# Patient Record
Sex: Female | Born: 1935 | Race: Black or African American | Hispanic: No | Marital: Married | Smoking: Former smoker
Health system: Southern US, Community
[De-identification: ages and names within clinical notes are randomized; demographics above are authoritative.]

## PROBLEM LIST (undated history)

## (undated) DIAGNOSIS — I10 Essential (primary) hypertension: Secondary | ICD-10-CM

## (undated) DIAGNOSIS — E559 Vitamin D deficiency, unspecified: Secondary | ICD-10-CM

## (undated) DIAGNOSIS — C3492 Malignant neoplasm of unspecified part of left bronchus or lung: Secondary | ICD-10-CM

## (undated) DIAGNOSIS — D649 Anemia, unspecified: Secondary | ICD-10-CM

## (undated) DIAGNOSIS — M810 Age-related osteoporosis without current pathological fracture: Secondary | ICD-10-CM

## (undated) DIAGNOSIS — M1990 Unspecified osteoarthritis, unspecified site: Secondary | ICD-10-CM

## (undated) DIAGNOSIS — E119 Type 2 diabetes mellitus without complications: Secondary | ICD-10-CM

## (undated) DIAGNOSIS — E785 Hyperlipidemia, unspecified: Secondary | ICD-10-CM

## (undated) HISTORY — PX: CATARACT EXTRACTION, BILATERAL: SHX1313

## (undated) HISTORY — PX: EYE SURGERY: SHX253

## (undated) HISTORY — DX: Type 2 diabetes mellitus without complications: E11.9

## (undated) HISTORY — DX: Age-related osteoporosis without current pathological fracture: M81.0

## (undated) HISTORY — DX: Hyperlipidemia, unspecified: E78.5

## (undated) HISTORY — DX: Malignant neoplasm of unspecified part of left bronchus or lung: C34.92

## (undated) HISTORY — DX: Anemia, unspecified: D64.9

## (undated) HISTORY — PX: CARPAL TUNNEL RELEASE: SHX101

## (undated) HISTORY — DX: Vitamin D deficiency, unspecified: E55.9

## (undated) HISTORY — PX: FOOT SURGERY: SHX648

## (undated) HISTORY — PX: KNEE ARTHROPLASTY: SHX992

## (undated) HISTORY — DX: Essential (primary) hypertension: I10

---

## 2005-06-15 ENCOUNTER — Encounter: Admission: RE | Admit: 2005-06-15 | Discharge: 2005-06-15 | Payer: Self-pay | Admitting: Neurology

## 2005-07-02 ENCOUNTER — Ambulatory Visit (HOSPITAL_COMMUNITY): Admission: RE | Admit: 2005-07-02 | Discharge: 2005-07-02 | Payer: Self-pay | Admitting: Neurology

## 2006-09-17 ENCOUNTER — Encounter: Admission: RE | Admit: 2006-09-17 | Discharge: 2006-09-17 | Payer: Self-pay | Admitting: Neurology

## 2011-10-05 DIAGNOSIS — H26499 Other secondary cataract, unspecified eye: Secondary | ICD-10-CM | POA: Insufficient documentation

## 2011-10-05 DIAGNOSIS — E119 Type 2 diabetes mellitus without complications: Secondary | ICD-10-CM | POA: Insufficient documentation

## 2011-10-09 DIAGNOSIS — Z9889 Other specified postprocedural states: Secondary | ICD-10-CM | POA: Insufficient documentation

## 2012-03-03 DIAGNOSIS — M79606 Pain in leg, unspecified: Secondary | ICD-10-CM | POA: Insufficient documentation

## 2012-03-03 DIAGNOSIS — E1142 Type 2 diabetes mellitus with diabetic polyneuropathy: Secondary | ICD-10-CM | POA: Insufficient documentation

## 2012-08-29 DIAGNOSIS — Z961 Presence of intraocular lens: Secondary | ICD-10-CM | POA: Insufficient documentation

## 2012-09-13 DIAGNOSIS — M8580 Other specified disorders of bone density and structure, unspecified site: Secondary | ICD-10-CM | POA: Insufficient documentation

## 2013-05-10 DIAGNOSIS — H401190 Primary open-angle glaucoma, unspecified eye, stage unspecified: Secondary | ICD-10-CM | POA: Insufficient documentation

## 2014-01-24 DIAGNOSIS — F17200 Nicotine dependence, unspecified, uncomplicated: Secondary | ICD-10-CM | POA: Insufficient documentation

## 2014-01-24 DIAGNOSIS — J189 Pneumonia, unspecified organism: Secondary | ICD-10-CM | POA: Insufficient documentation

## 2014-01-24 DIAGNOSIS — J159 Unspecified bacterial pneumonia: Secondary | ICD-10-CM | POA: Insufficient documentation

## 2014-01-24 DIAGNOSIS — R918 Other nonspecific abnormal finding of lung field: Secondary | ICD-10-CM | POA: Insufficient documentation

## 2014-01-24 DIAGNOSIS — D649 Anemia, unspecified: Secondary | ICD-10-CM | POA: Insufficient documentation

## 2014-02-21 LAB — PULMONARY FUNCTION TEST

## 2014-03-14 ENCOUNTER — Encounter: Payer: Self-pay | Admitting: Thoracic Surgery (Cardiothoracic Vascular Surgery)

## 2014-03-14 ENCOUNTER — Other Ambulatory Visit: Payer: Self-pay

## 2014-03-19 ENCOUNTER — Institutional Professional Consult (permissible substitution) (INDEPENDENT_AMBULATORY_CARE_PROVIDER_SITE_OTHER): Payer: Medicare Other | Admitting: Thoracic Surgery (Cardiothoracic Vascular Surgery)

## 2014-03-19 ENCOUNTER — Encounter: Payer: Self-pay | Admitting: Thoracic Surgery (Cardiothoracic Vascular Surgery)

## 2014-03-19 VITALS — BP 154/69 | HR 79 | Resp 16 | Ht 64.0 in | Wt 152.0 lb

## 2014-03-19 DIAGNOSIS — J439 Emphysema, unspecified: Secondary | ICD-10-CM | POA: Diagnosis not present

## 2014-03-19 DIAGNOSIS — R911 Solitary pulmonary nodule: Secondary | ICD-10-CM

## 2014-03-19 DIAGNOSIS — E785 Hyperlipidemia, unspecified: Secondary | ICD-10-CM | POA: Insufficient documentation

## 2014-03-19 DIAGNOSIS — I10 Essential (primary) hypertension: Secondary | ICD-10-CM | POA: Insufficient documentation

## 2014-03-19 DIAGNOSIS — H409 Unspecified glaucoma: Secondary | ICD-10-CM | POA: Insufficient documentation

## 2014-03-19 DIAGNOSIS — E119 Type 2 diabetes mellitus without complications: Secondary | ICD-10-CM | POA: Diagnosis not present

## 2014-03-19 DIAGNOSIS — G2581 Restless legs syndrome: Secondary | ICD-10-CM | POA: Insufficient documentation

## 2014-03-19 DIAGNOSIS — K219 Gastro-esophageal reflux disease without esophagitis: Secondary | ICD-10-CM | POA: Insufficient documentation

## 2014-03-19 NOTE — Progress Notes (Signed)
PCP is No primary care provider on file. Referring Provider is Gardiner Rhyme, MD  Chief Complaint  Patient presents with  . Lung Lesion    eval and treat...CT/PET/PFT'S    HPI: 81 year old woman sent for consultation regarding a left lower lobe mass.  Gwendolyn Miller is an 81 year old woman with a history of tobacco abuse dating back from age 61 to December 2015. She was never a heavy smoker but did smoke up to a half a pack a day. Her total exposure is 60 pack years. She was in her usual state of health until December 2015. She developed a cough and general malaise. She denied any fevers or chills. Her cough was productive. She was hospitalized with pneumonia. Her chest x-ray showed a right middle lobe infiltrate. There also was a question of a left lung nodule. CT scan confirmed the right middle lobe infiltrate and there was a 2 cm nodule in the superior segment of the left lower lobe. She was only hospitalized for about 3 days and then was discharged home.  She had a PET CT done as an outpatient. It showed the left lower lobe nodule to be hypermetabolic. The SUV was 8.8. Per the radiologist's report the right middle lobe infiltrate had cleared.  She says she's been feeling well. She does most of the work around the house. Her husband does have some health problems. Her appetite is been poor. She says she gets full very quickly. She denies any weight loss over the past 3 or 6 months. She says that she can walk up a flight of stairs easily without shortness of breath. She says that she would get unsteady if she try to do 2 flights but not necessarily short of breath. She has not been having any chest pain, pressure, or tightness either at rest or with exertion. She uses a Spiriva inhaler and Symbicort on a daily basis. She says she does not have any wheezing. She is not using a rescue inhaler. She denies any cardiac history. She does have a history of hypertension.  Zubrod Score: At the time of  surgery this patient's most appropriate activity status/level should be described as: []     0    Normal activity, no symptoms [x]     1    Restricted in physical strenuous activity but ambulatory, able to do out light work []     2    Ambulatory and capable of self care, unable to do work activities, up and about >50 % of waking hours                              []     3    Only limited self care, in bed greater than 50% of waking hours []     4    Completely disabled, no self care, confined to bed or chair []     5    Moribund    Past Medical History  Diagnosis Date  . Hypertension   . Hyperlipidemia   . Anemia   . Vitamin D deficiency   . Osteoporosis   . Diabetes mellitus, type 2   . Asthma     mild    Past Surgical History  Procedure Laterality Date  . Knee arthroplasty    . Cataract extraction, bilateral      Family History  Problem Relation Age of Onset  . Heart failure Father     de  .  Dementia Sister     Social History History  Substance Use Topics  . Smoking status: Former Smoker -- 0.50 packs/day for 60 years    Types: Cigarettes    Quit date: 01/09/2014  . Smokeless tobacco: Never Used  . Alcohol Use: No    Current Outpatient Prescriptions  Medication Sig Dispense Refill  . alendronate (FOSAMAX) 70 MG tablet   0  . amLODipine (NORVASC) 10 MG tablet     . atenolol (TENORMIN) 100 MG tablet     . COSOPT PF 22.3-6.8 MG/ML SOLN   12  . gabapentin (NEURONTIN) 400 MG capsule   0  . guaiFENesin (MUCINEX) 600 MG 12 hr tablet   1  . ketorolac (ACULAR) 0.4 % SOLN     . latanoprost (XALATAN) 0.005 % ophthalmic solution   12  . oxyCODONE-acetaminophen (PERCOCET/ROXICET) 5-325 MG per tablet   0  . pramipexole (MIRAPEX) 0.125 MG tablet   3  . SPIRIVA HANDIHALER 18 MCG inhalation capsule   2  . SYMBICORT 160-4.5 MCG/ACT inhaler   3  . topiramate (TOPAMAX) 100 MG tablet      No current facility-administered medications for this visit.    Allergies  Allergen  Reactions  . Erythromycin Swelling and Nausea And Vomiting    Other reaction(s): Nausea And Vomiting  . Metaxalone Rash    Chest pain Chest pain  . Pramipexole Rash    abd pain  . Ace Inhibitors     Other reaction(s): Cough cough  . Zolpidem Nausea Only    Review of Systems  Constitutional: Positive for appetite change (early satiety). Negative for fever, chills, activity change and unexpected weight change (Denies weight loss).  Respiratory: Positive for cough (Pneumonia in December). Negative for shortness of breath.   Cardiovascular: Positive for leg swelling (When she eats a lot of salt). Negative for chest pain.  Neurological: Negative.   Hematological: Negative for adenopathy. Does not bruise/bleed easily.  All other systems reviewed and are negative.   BP 154/69 mmHg  Pulse 79  Resp 16  Ht 5\' 4"  (1.626 m)  Wt 152 lb (68.947 kg)  BMI 26.08 kg/m2  SpO2 98% Physical Exam  Constitutional: She is oriented to person, place, and time. She appears well-developed and well-nourished. No distress.  HENT:  Head: Normocephalic and atraumatic.  Eyes: EOM are normal.  Neck: Neck supple. No thyromegaly present.  Cardiovascular: Normal rate, normal heart sounds and intact distal pulses.   No murmur heard. Pulmonary/Chest: Effort normal and breath sounds normal. She has no wheezes. She has no rales.  Abdominal: Soft. There is no tenderness.  Musculoskeletal: She exhibits no edema.  Lymphadenopathy:    She has no cervical adenopathy.  Neurological: She is alert and oriented to person, place, and time. No cranial nerve deficit.  Skin: Skin is warm and dry.  Vitals reviewed.    Diagnostic Tests: CT chest [facility] 01/24/2014  Impression Consolidation in the right middle lobe is more concerning for pneumonia, less likely neoplasm. Opacity in left midlung is more concerning for neoplasm, less likely pneumonia 4 mm left lung nodule  PET/CT [facility]  02/22/2014  Impression 1. Hypermetabolic left lower lobe nodule worrisome for neoplasm. 2. Interval clearing of right middle lobe infiltrate.  Pulmonary function testing FVC 1.42 (67%) FEV1 1.22 (74%) TLC 64% of predicted VC 73% predicted Diffusion capacity 15.1 (85%) Minimal change with bronchodilator (patient taking once daily bronchodilator baseline)   Impression: 81 year old woman with a 2 cm mass in the  superior segment of the left lower lobe that is hypermetabolic by PET with an SUV of 8.8. There is no evidence of regional or distant metastases.  I personally reviewed the chest x-ray and CT that she brought along with her on disc. I also reviewed her pulmonary function testing. Unfortunately the PET images are not available to me. I based my recommendation on my interpretation of the CT as well as the radiologist's interpretation of the PET. This is highly suspicious for a new primary bronchogenic carcinoma.   I discussed our options for diagnosis and treatment. This is a very peripheral lesion that would be hard to access bronchoscopically. It is amenable to CT-guided percutaneous biopsy. We discussed the advantages and disadvantages of surgical resection versus a CT-guided biopsy. Given the highly suspicious nature of the lesion I think that if she is willing to undergo surgical resection that there is no need to get a CT-guided biopsy first. If she wished to be treated with radiation and/or chemotherapy to CT-guided biopsy would be indicated.  I discussed the proposed surgical procedure with Mr. and Mrs. Dady. We reviewed the general nature of the procedure, the incisions to be used, the need for general anesthesia, and the intraoperative decision making. They understand that we will plan to do a bronchoscopy, left video-assisted thoracoscopy and superior segmentectomy of the left lower lobe. This will be both diagnostic and therapeutic. Although she has a clinical stage IA lesion  they understand that additional therapy might be recommended based on pathologic findings at time of surgery. I reviewed the indications, risks, benefits, and alternatives. She understands the risks include, but are not limited to death, MI, stroke, DVT, PE, bleeding, possibly for transfusion, infection, prolonged air leak, cardiac arrhythmias, as well as the possibility of unforeseeable complications. They understand that no guarantee of cure can be given.  She accepts the risks of surgery and agrees to proceed.  Plan: Bronchoscopy, Left video-assisted thoracoscopy, left lower lobe superior segmentectomy on Monday February 22nd, 2016.  We will request PET/CT to be sent from [facility]. I also asked her to bring a copy of the PET on disc when she comes for surgery.  I spent 60 minutes with Mrs. Gwendolyn Miller.D.

## 2014-03-20 ENCOUNTER — Other Ambulatory Visit: Payer: Self-pay | Admitting: *Deleted

## 2014-03-20 DIAGNOSIS — R911 Solitary pulmonary nodule: Secondary | ICD-10-CM

## 2014-03-29 ENCOUNTER — Encounter (HOSPITAL_COMMUNITY): Payer: Self-pay

## 2014-03-29 ENCOUNTER — Encounter (HOSPITAL_COMMUNITY)
Admission: RE | Admit: 2014-03-29 | Discharge: 2014-03-29 | Disposition: A | Discharge status: Home / Self Care | Payer: Medicare Other | Source: Ambulatory Visit | Attending: Thoracic Surgery (Cardiothoracic Vascular Surgery) | Admitting: Thoracic Surgery (Cardiothoracic Vascular Surgery)

## 2014-03-29 VITALS — BP 139/60 | HR 79 | Temp 98.5°F | Resp 18 | Ht 64.0 in

## 2014-03-29 DIAGNOSIS — Z01818 Encounter for other preprocedural examination: Secondary | ICD-10-CM | POA: Insufficient documentation

## 2014-03-29 DIAGNOSIS — R911 Solitary pulmonary nodule: Secondary | ICD-10-CM | POA: Diagnosis not present

## 2014-03-29 HISTORY — DX: Unspecified osteoarthritis, unspecified site: M19.90

## 2014-03-29 LAB — COMPREHENSIVE METABOLIC PANEL
ALBUMIN: 3.8 g/dL (ref 3.5–5.2)
ALK PHOS: 47 U/L (ref 39–117)
ALT: 13 U/L (ref 0–35)
AST: 22 U/L (ref 0–37)
Anion gap: 10 (ref 5–15)
BUN: 8 mg/dL (ref 6–23)
CHLORIDE: 112 mmol/L (ref 96–112)
CO2: 19 mmol/L (ref 19–32)
Calcium: 9.3 mg/dL (ref 8.4–10.5)
Creatinine, Ser: 0.73 mg/dL (ref 0.50–1.10)
GFR calc Af Amer: >90 mL/min (ref >90)
GFR calc non Af Amer: 79 mL/min — ABNORMAL LOW (ref >90)
Glucose, Bld: 124 mg/dL — ABNORMAL HIGH (ref 70–99)
POTASSIUM: 4 mmol/L (ref 3.5–5.1)
Sodium: 141 mmol/L (ref 135–145)
Total Bilirubin: 0.5 mg/dL (ref 0.3–1.2)
Total Protein: 6.9 g/dL (ref 6.0–8.3)

## 2014-03-29 LAB — URINALYSIS, ROUTINE W REFLEX MICROSCOPIC
BILIRUBIN URINE: NEGATIVE
GLUCOSE, UA: NEGATIVE mg/dL
HGB URINE DIPSTICK: NEGATIVE
Ketones, ur: NEGATIVE mg/dL
Leukocytes, UA: NEGATIVE
NITRITE: NEGATIVE
PH: 6.5 (ref 5.0–8.0)
PROTEIN: 30 mg/dL — AB
SPECIFIC GRAVITY, URINE: 1.018 (ref 1.005–1.030)
UROBILINOGEN UA: 1 mg/dL (ref 0.0–1.0)

## 2014-03-29 LAB — TYPE AND SCREEN
ABO/RH(D): B POS
Antibody Screen: NEGATIVE

## 2014-03-29 LAB — URINE MICROSCOPIC-ADD ON

## 2014-03-29 LAB — BLOOD GAS, ARTERIAL
ACID-BASE DEFICIT: 3.6 mmol/L — AB (ref 0.0–2.0)
Bicarbonate: 20.7 mEq/L (ref 20.0–24.0)
Drawn by: 42180
FIO2: 0.21 %
O2 SAT: 92.6 %
PATIENT TEMPERATURE: 98.6
TCO2: 21.8 mmol/L (ref 0–100)
pCO2 arterial: 35.9 mmHg (ref 35.0–45.0)
pH, Arterial: 7.379 (ref 7.350–7.450)
pO2, Arterial: 66.5 mmHg — ABNORMAL LOW (ref 80.0–100.0)

## 2014-03-29 LAB — SURGICAL PCR SCREEN
MRSA, PCR: NEGATIVE
Staphylococcus aureus: NEGATIVE

## 2014-03-29 LAB — CBC
HEMATOCRIT: 36.6 % (ref 36.0–46.0)
HEMOGLOBIN: 11.5 g/dL — AB (ref 12.0–15.0)
MCH: 22.5 pg — ABNORMAL LOW (ref 26.0–34.0)
MCHC: 31.4 g/dL (ref 30.0–36.0)
MCV: 71.5 fL — ABNORMAL LOW (ref 78.0–100.0)
Platelets: 249 10*3/uL (ref 150–400)
RBC: 5.12 MIL/uL — AB (ref 3.87–5.11)
RDW: 16.8 % — ABNORMAL HIGH (ref 11.5–15.5)
WBC: 6 10*3/uL (ref 4.0–10.5)

## 2014-03-29 LAB — ABO/RH: ABO/RH(D): B POS

## 2014-03-29 LAB — PROTIME-INR
INR: 0.96 (ref 0.00–1.49)
Prothrombin Time: 12.9 seconds (ref 11.6–15.2)

## 2014-03-29 LAB — APTT: APTT: 32 s (ref 24–37)

## 2014-03-29 NOTE — Pre-Procedure Instructions (Signed)
Gwendolyn Miller  03/29/2014   Your procedure is scheduled on:  Monday, feb. 22  Report to [facility] Main Entrance "A" at 5:30 AM.  Call this number if you have problems the morning of surgery: [phone]               Any questions prior ot surgery call pre-admission [phone] (Monday-Friday  8:00am-4:00pm)   Remember:   Do not eat food or drink liquids after midnight.   Take these medicines the morning of surgery with A SIP OF WATER: amoldipine (norvasc), atenolol (tenormin), cosopt eye drops, gabapentin(neurontin), acular eye drop, omeprazole (prilosec), oxycodone if needed, symbicort (bring both of the inhalers)   Do not wear jewelry, make-up or nail polish.  Do not wear lotions, powders, or perfumes. You may wear deodorant.  Do not shave 48 hours prior to surgery. Men may shave face and neck.  Do not bring valuables to the hospital.  [facility] is not responsible   for any belongings or valuables.               Contacts, dentures or bridgework may not be worn into surgery.  Leave suitcase in the car. After surgery it may be brought to your room.  For patients admitted to the hospital, discharge time is determined by your  treatment team.               Patients discharged the day of surgery will not be allowed to drive  home.  Name and phone number of your driver:   Special Instructions: review preparing for surgery   Please read over the following fact sheets that you were given: Pain Booklet, Coughing and Deep Breathing, Blood Transfusion Information, MRSA Information and Surgical Site Infection Prevention

## 2014-03-29 NOTE — Progress Notes (Signed)
[facility]  PU[facility]ologist: Dr. Gardiner Rhyme [facility]

## 2014-04-01 MED ORDER — DEXTROSE 5 % IV SOLN
1.5000 g | INTRAVENOUS | Status: AC
  Administered 2014-04-02: 1.5 g via INTRAVENOUS
  Filled 2014-04-01: qty 1.5

## 2014-04-02 ENCOUNTER — Inpatient Hospital Stay (HOSPITAL_COMMUNITY): Payer: Medicare Other

## 2014-04-02 ENCOUNTER — Inpatient Hospital Stay (HOSPITAL_COMMUNITY): Payer: Medicare Other | Admitting: Certified Registered Nurse Anesthetist

## 2014-04-02 ENCOUNTER — Inpatient Hospital Stay (HOSPITAL_COMMUNITY)
Admission: RE | Admit: 2014-04-02 | Discharge: 2014-04-06 | DRG: 165 | Disposition: A | Discharge status: Home / Self Care | Payer: Medicare Other | Source: Ambulatory Visit | Attending: Thoracic Surgery (Cardiothoracic Vascular Surgery) | Admitting: Thoracic Surgery (Cardiothoracic Vascular Surgery)

## 2014-04-02 ENCOUNTER — Encounter (HOSPITAL_COMMUNITY): Payer: Self-pay | Admitting: *Deleted

## 2014-04-02 ENCOUNTER — Encounter (HOSPITAL_COMMUNITY)
Admission: RE | Disposition: A | Discharge status: Home / Self Care | Payer: Self-pay | Source: Ambulatory Visit | Attending: Thoracic Surgery (Cardiothoracic Vascular Surgery)

## 2014-04-02 DIAGNOSIS — C3432 Malignant neoplasm of lower lobe, left bronchus or lung: Principal | ICD-10-CM | POA: Diagnosis present

## 2014-04-02 DIAGNOSIS — M810 Age-related osteoporosis without current pathological fracture: Secondary | ICD-10-CM | POA: Diagnosis present

## 2014-04-02 DIAGNOSIS — E876 Hypokalemia: Secondary | ICD-10-CM | POA: Diagnosis not present

## 2014-04-02 DIAGNOSIS — Z4682 Encounter for fitting and adjustment of non-vascular catheter: Secondary | ICD-10-CM

## 2014-04-02 DIAGNOSIS — R911 Solitary pulmonary nodule: Secondary | ICD-10-CM | POA: Diagnosis present

## 2014-04-02 DIAGNOSIS — Z902 Acquired absence of lung [part of]: Secondary | ICD-10-CM

## 2014-04-02 DIAGNOSIS — E119 Type 2 diabetes mellitus without complications: Secondary | ICD-10-CM | POA: Diagnosis present

## 2014-04-02 DIAGNOSIS — E785 Hyperlipidemia, unspecified: Secondary | ICD-10-CM | POA: Diagnosis present

## 2014-04-02 DIAGNOSIS — C3490 Malignant neoplasm of unspecified part of unspecified bronchus or lung: Secondary | ICD-10-CM

## 2014-04-02 DIAGNOSIS — D649 Anemia, unspecified: Secondary | ICD-10-CM | POA: Diagnosis present

## 2014-04-02 DIAGNOSIS — I10 Essential (primary) hypertension: Secondary | ICD-10-CM | POA: Diagnosis present

## 2014-04-02 DIAGNOSIS — Z87891 Personal history of nicotine dependence: Secondary | ICD-10-CM | POA: Diagnosis not present

## 2014-04-02 HISTORY — PX: VIDEO ASSISTED THORACOSCOPY (VATS)/WEDGE RESECTION: SHX6174

## 2014-04-02 HISTORY — PX: LUNG REMOVAL, PARTIAL: SHX233

## 2014-04-02 HISTORY — PX: LYMPH NODE DISSECTION: SHX5087

## 2014-04-02 LAB — GLUCOSE, CAPILLARY
GLUCOSE-CAPILLARY: 120 mg/dL — AB (ref 70–99)
GLUCOSE-CAPILLARY: 141 mg/dL — AB (ref 70–99)
GLUCOSE-CAPILLARY: 124 mg/dL — AB (ref 70–99)
Glucose-Capillary: 146 mg/dL — ABNORMAL HIGH (ref 70–99)

## 2014-04-02 SURGERY — VIDEO ASSISTED THORACOSCOPY (VATS)/WEDGE RESECTION
Anesthesia: General | Site: Chest | Laterality: Left

## 2014-04-02 MED ORDER — ROCURONIUM BROMIDE 100 MG/10ML IV SOLN
INTRAVENOUS | Status: DC | PRN
Start: 2014-04-02 — End: 2014-04-02
  Administered 2014-04-02: 5 mg via INTRAVENOUS
  Administered 2014-04-02: 50 mg via INTRAVENOUS

## 2014-04-02 MED ORDER — PROPOFOL 10 MG/ML IV BOLUS
INTRAVENOUS | Status: AC
  Filled 2014-04-02: qty 20

## 2014-04-02 MED ORDER — BUPIVACAINE 0.25 % ON-Q PUMP SINGLE CATH 400 ML
INJECTION | Status: DC | PRN
  Administered 2014-04-02: 400 mL

## 2014-04-02 MED ORDER — ACETAMINOPHEN 500 MG PO TABS
1000.0000 mg | ORAL_TABLET | Freq: Four times a day (QID) | ORAL | Status: DC
  Administered 2014-04-02 – 2014-04-06 (×13): 1000 mg via ORAL
  Filled 2014-04-02 (×19): qty 2

## 2014-04-02 MED ORDER — ONDANSETRON HCL 4 MG/2ML IJ SOLN
INTRAMUSCULAR | Status: AC
  Filled 2014-04-02: qty 2

## 2014-04-02 MED ORDER — BUPIVACAINE HCL (PF) 0.25 % IJ SOLN
INTRAMUSCULAR | Status: DC | PRN
  Administered 2014-04-02: 5 mL

## 2014-04-02 MED ORDER — ACETAMINOPHEN 10 MG/ML IV SOLN
INTRAVENOUS | Status: DC | PRN
  Administered 2014-04-02: 1000 mg via INTRAVENOUS

## 2014-04-02 MED ORDER — PROPOFOL 10 MG/ML IV BOLUS
INTRAVENOUS | Status: DC | PRN
  Administered 2014-04-02: 150 mg via INTRAVENOUS

## 2014-04-02 MED ORDER — DORZOLAMIDE HCL 2 % OP SOLN
1.0000 [drp] | Freq: Two times a day (BID) | OPHTHALMIC | Status: DC
  Administered 2014-04-02 – 2014-04-06 (×8): 1 [drp] via OPHTHALMIC
  Filled 2014-04-02 (×2): qty 10

## 2014-04-02 MED ORDER — SODIUM CHLORIDE 0.9 % IJ SOLN
9.0000 mL | INTRAMUSCULAR | Status: DC | PRN
  Administered 2014-04-02: 9 mL via INTRAVENOUS
  Filled 2014-04-02: qty 9

## 2014-04-02 MED ORDER — FENTANYL 10 MCG/ML IV SOLN
INTRAVENOUS | Status: DC
  Administered 2014-04-02 (×2): 10 ug via INTRAVENOUS
  Administered 2014-04-02: 11:00:00 via INTRAVENOUS
  Administered 2014-04-02: 50 ug via INTRAVENOUS
  Administered 2014-04-03: 10 ug via INTRAVENOUS
  Administered 2014-04-03 (×2): 20 ug via INTRAVENOUS
  Administered 2014-04-03: 40 ug via INTRAVENOUS
  Administered 2014-04-03: 0 ug via INTRAVENOUS
  Administered 2014-04-03: 40 ug via INTRAVENOUS
  Administered 2014-04-04: 10 ug via INTRAVENOUS
  Administered 2014-04-04: 40 ug via INTRAVENOUS
  Administered 2014-04-04 (×2): 0 ug via INTRAVENOUS
  Administered 2014-04-05: 30 ug via INTRAVENOUS
  Administered 2014-04-05: 10 ug via INTRAVENOUS
  Filled 2014-04-02: qty 50

## 2014-04-02 MED ORDER — LIDOCAINE HCL (CARDIAC) 20 MG/ML IV SOLN
INTRAVENOUS | Status: AC
  Filled 2014-04-02: qty 5

## 2014-04-02 MED ORDER — BUPIVACAINE ON-Q PAIN PUMP (FOR ORDER SET NO CHG)
INJECTION | Status: DC
  Filled 2014-04-02: qty 1

## 2014-04-02 MED ORDER — LEVALBUTEROL HCL 0.63 MG/3ML IN NEBU
0.6300 mg | INHALATION_SOLUTION | Freq: Four times a day (QID) | RESPIRATORY_TRACT | Status: DC
  Administered 2014-04-02: 0.63 mg via RESPIRATORY_TRACT
  Filled 2014-04-02: qty 3

## 2014-04-02 MED ORDER — SIMVASTATIN 40 MG PO TABS
40.0000 mg | ORAL_TABLET | Freq: Every day | ORAL | Status: DC
  Administered 2014-04-02 – 2014-04-05 (×4): 40 mg via ORAL
  Filled 2014-04-02 (×5): qty 1

## 2014-04-02 MED ORDER — PHENYLEPHRINE HCL 10 MG/ML IJ SOLN
10.0000 mg | INTRAVENOUS | Status: DC | PRN
  Administered 2014-04-02: 20 ug/min via INTRAVENOUS

## 2014-04-02 MED ORDER — SODIUM CHLORIDE 0.9 % IV SOLN
INTRAVENOUS | Status: DC
  Administered 2014-04-03: 21:00:00 via INTRAVENOUS

## 2014-04-02 MED ORDER — BUPIVACAINE 0.25 % ON-Q PUMP SINGLE CATH 400 ML
400.0000 mL | INJECTION | Status: AC
  Filled 2014-04-02 (×2): qty 400

## 2014-04-02 MED ORDER — KETOROLAC TROMETHAMINE 0.4 % OP SOLN: 1.0000 [drp] | Freq: Three times a day (TID) | OPHTHALMIC | Status: DC

## 2014-04-02 MED ORDER — TOPIRAMATE 100 MG PO TABS
100.0000 mg | ORAL_TABLET | Freq: Every day | ORAL | Status: DC
  Administered 2014-04-02 – 2014-04-05 (×4): 100 mg via ORAL
  Filled 2014-04-02 (×5): qty 1

## 2014-04-02 MED ORDER — INSULIN ASPART 100 UNIT/ML ~~LOC~~ SOLN
0.0000 [IU] | Freq: Three times a day (TID) | SUBCUTANEOUS | Status: DC
  Administered 2014-04-02 – 2014-04-03 (×3): 2 [IU] via SUBCUTANEOUS
  Administered 2014-04-03: 4 [IU] via SUBCUTANEOUS
  Administered 2014-04-03 – 2014-04-05 (×4): 2 [IU] via SUBCUTANEOUS

## 2014-04-02 MED ORDER — WHITE PETROLATUM GEL
Status: AC
  Administered 2014-04-02: 16:00:00
  Filled 2014-04-02: qty 1

## 2014-04-02 MED ORDER — GLYCOPYRROLATE 0.2 MG/ML IJ SOLN
INTRAMUSCULAR | Status: DC | PRN
  Administered 2014-04-02: 0.6 mg via INTRAVENOUS

## 2014-04-02 MED ORDER — EPHEDRINE SULFATE 50 MG/ML IJ SOLN
INTRAMUSCULAR | Status: AC
  Filled 2014-04-02: qty 1

## 2014-04-02 MED ORDER — ONDANSETRON HCL 4 MG/2ML IJ SOLN: 4.0000 mg | Freq: Four times a day (QID) | INTRAMUSCULAR | Status: DC | PRN

## 2014-04-02 MED ORDER — PANTOPRAZOLE SODIUM 40 MG PO TBEC
80.0000 mg | DELAYED_RELEASE_TABLET | Freq: Every day | ORAL | Status: DC
  Administered 2014-04-02 – 2014-04-06 (×5): 80 mg via ORAL
  Filled 2014-04-02 (×5): qty 2

## 2014-04-02 MED ORDER — BISACODYL 5 MG PO TBEC
10.0000 mg | DELAYED_RELEASE_TABLET | Freq: Every day | ORAL | Status: DC
  Administered 2014-04-03 – 2014-04-06 (×4): 10 mg via ORAL
  Filled 2014-04-02 (×5): qty 2

## 2014-04-02 MED ORDER — KETOROLAC TROMETHAMINE 0.5 % OP SOLN
1.0000 [drp] | Freq: Three times a day (TID) | OPHTHALMIC | Status: DC
  Administered 2014-04-02 – 2014-04-05 (×10): 1 [drp] via OPHTHALMIC
  Filled 2014-04-02 (×2): qty 3

## 2014-04-02 MED ORDER — MIDAZOLAM HCL 2 MG/2ML IJ SOLN
INTRAMUSCULAR | Status: AC
  Filled 2014-04-02: qty 2

## 2014-04-02 MED ORDER — LACTATED RINGERS IV SOLN
INTRAVENOUS | Status: DC | PRN
  Administered 2014-04-02: 07:00:00 via INTRAVENOUS

## 2014-04-02 MED ORDER — AMLODIPINE BESYLATE 10 MG PO TABS
10.0000 mg | ORAL_TABLET | Freq: Every day | ORAL | Status: DC
  Administered 2014-04-02 – 2014-04-06 (×5): 10 mg via ORAL
  Filled 2014-04-02 (×6): qty 1

## 2014-04-02 MED ORDER — BUPIVACAINE HCL (PF) 0.25 % IJ SOLN
INTRAMUSCULAR | Status: AC
  Filled 2014-04-02: qty 10

## 2014-04-02 MED ORDER — DORZOLAMIDE HCL-TIMOLOL MAL PF 22.3-6.8 MG/ML OP SOLN: 1.0000 [drp] | Freq: Two times a day (BID) | OPHTHALMIC | Status: DC

## 2014-04-02 MED ORDER — SUCCINYLCHOLINE CHLORIDE 20 MG/ML IJ SOLN
INTRAMUSCULAR | Status: AC
  Filled 2014-04-02: qty 1

## 2014-04-02 MED ORDER — FENTANYL CITRATE 0.05 MG/ML IJ SOLN
INTRAMUSCULAR | Status: AC
  Filled 2014-04-02: qty 5

## 2014-04-02 MED ORDER — FENTANYL CITRATE 0.05 MG/ML IJ SOLN
INTRAMUSCULAR | Status: AC
  Filled 2014-04-02: qty 2

## 2014-04-02 MED ORDER — ATENOLOL 100 MG PO TABS
100.0000 mg | ORAL_TABLET | Freq: Two times a day (BID) | ORAL | Status: DC
  Administered 2014-04-03 – 2014-04-06 (×7): 100 mg via ORAL
  Filled 2014-04-02 (×8): qty 1

## 2014-04-02 MED ORDER — ASPIRIN EC 81 MG PO TBEC
81.0000 mg | DELAYED_RELEASE_TABLET | Freq: Every day | ORAL | Status: DC
  Administered 2014-04-03 – 2014-04-06 (×4): 81 mg via ORAL
  Filled 2014-04-02 (×4): qty 1

## 2014-04-02 MED ORDER — LIDOCAINE HCL (CARDIAC) 20 MG/ML IV SOLN
INTRAVENOUS | Status: DC | PRN
  Administered 2014-04-02: 100 mg via INTRAVENOUS

## 2014-04-02 MED ORDER — CETYLPYRIDINIUM CHLORIDE 0.05 % MT LIQD
7.0000 mL | Freq: Two times a day (BID) | OROMUCOSAL | Status: DC
  Administered 2014-04-03 – 2014-04-06 (×8): 7 mL via OROMUCOSAL

## 2014-04-02 MED ORDER — OXYCODONE HCL 5 MG PO TABS: 5.0000 mg | ORAL_TABLET | Freq: Once | ORAL | Status: DC | PRN

## 2014-04-02 MED ORDER — NALOXONE HCL 0.4 MG/ML IJ SOLN: 0.4000 mg | INTRAMUSCULAR | Status: DC | PRN

## 2014-04-02 MED ORDER — FENTANYL CITRATE 0.05 MG/ML IJ SOLN
INTRAMUSCULAR | Status: DC | PRN
  Administered 2014-04-02: 25 ug via INTRAVENOUS
  Administered 2014-04-02: 100 ug via INTRAVENOUS
  Administered 2014-04-02: 25 ug via INTRAVENOUS
  Administered 2014-04-02 (×3): 50 ug via INTRAVENOUS

## 2014-04-02 MED ORDER — ATENOLOL 100 MG PO TABS
100.0000 mg | ORAL_TABLET | Freq: Once | ORAL | Status: AC
  Administered 2014-04-02: 100 mg via ORAL
  Filled 2014-04-02: qty 1

## 2014-04-02 MED ORDER — OXYCODONE HCL 5 MG/5ML PO SOLN: 5.0000 mg | Freq: Once | ORAL | Status: DC | PRN

## 2014-04-02 MED ORDER — LATANOPROST 0.005 % OP SOLN
1.0000 [drp] | Freq: Every day | OPHTHALMIC | Status: DC
  Administered 2014-04-03 – 2014-04-05 (×3): 1 [drp] via OPHTHALMIC
  Filled 2014-04-02: qty 2.5

## 2014-04-02 MED ORDER — BUDESONIDE-FORMOTEROL FUMARATE 160-4.5 MCG/ACT IN AERO
2.0000 | INHALATION_SPRAY | Freq: Two times a day (BID) | RESPIRATORY_TRACT | Status: DC
  Administered 2014-04-02 – 2014-04-06 (×8): 2 via RESPIRATORY_TRACT
  Filled 2014-04-02: qty 6

## 2014-04-02 MED ORDER — METOCLOPRAMIDE HCL 5 MG/ML IJ SOLN
10.0000 mg | Freq: Four times a day (QID) | INTRAMUSCULAR | Status: AC
  Administered 2014-04-03 (×3): 10 mg via INTRAVENOUS
  Filled 2014-04-02 (×4): qty 2

## 2014-04-02 MED ORDER — TIOTROPIUM BROMIDE MONOHYDRATE 18 MCG IN CAPS
18.0000 ug | ORAL_CAPSULE | Freq: Every day | RESPIRATORY_TRACT | Status: DC
  Administered 2014-04-03 – 2014-04-06 (×4): 18 ug via RESPIRATORY_TRACT
  Filled 2014-04-02: qty 5

## 2014-04-02 MED ORDER — ONDANSETRON HCL 4 MG/2ML IJ SOLN
4.0000 mg | Freq: Four times a day (QID) | INTRAMUSCULAR | Status: DC | PRN
Start: 2014-04-02 — End: 2014-04-05

## 2014-04-02 MED ORDER — NEOSTIGMINE METHYLSULFATE 10 MG/10ML IV SOLN
INTRAVENOUS | Status: DC | PRN
  Administered 2014-04-02: 4 mg via INTRAVENOUS

## 2014-04-02 MED ORDER — 0.9 % SODIUM CHLORIDE (POUR BTL) OPTIME
TOPICAL | Status: DC | PRN
  Administered 2014-04-02: 2000 mL

## 2014-04-02 MED ORDER — DIPHENHYDRAMINE HCL 50 MG/ML IJ SOLN: 12.5000 mg | Freq: Four times a day (QID) | INTRAMUSCULAR | Status: DC | PRN

## 2014-04-02 MED ORDER — ROCURONIUM BROMIDE 50 MG/5ML IV SOLN
INTRAVENOUS | Status: AC
  Filled 2014-04-02: qty 1

## 2014-04-02 MED ORDER — OXYCODONE-ACETAMINOPHEN 5-325 MG PO TABS
1.0000 | ORAL_TABLET | ORAL | Status: DC | PRN
  Administered 2014-04-02 – 2014-04-03 (×2): 2 via ORAL
  Administered 2014-04-04 – 2014-04-05 (×2): 1 via ORAL
  Filled 2014-04-02 (×2): qty 1
  Filled 2014-04-02 (×2): qty 2
  Filled 2014-04-02: qty 1

## 2014-04-02 MED ORDER — TIMOLOL MALEATE 0.5 % OP SOLN
1.0000 [drp] | Freq: Two times a day (BID) | OPHTHALMIC | Status: DC
  Administered 2014-04-02 – 2014-04-06 (×8): 1 [drp] via OPHTHALMIC
  Filled 2014-04-02 (×2): qty 5

## 2014-04-02 MED ORDER — ONDANSETRON HCL 4 MG/2ML IJ SOLN
INTRAMUSCULAR | Status: DC | PRN
  Administered 2014-04-02: 4 mg via INTRAVENOUS

## 2014-04-02 MED ORDER — LIDOCAINE HCL 4 % MT SOLN
OROMUCOSAL | Status: DC | PRN
  Administered 2014-04-02: 4 mL via TOPICAL

## 2014-04-02 MED ORDER — ACETAMINOPHEN 160 MG/5ML PO SOLN
1000.0000 mg | Freq: Four times a day (QID) | ORAL | Status: DC
Start: 2014-04-02 — End: 2014-04-06
  Filled 2014-04-02 (×19): qty 40

## 2014-04-02 MED ORDER — SENNOSIDES-DOCUSATE SODIUM 8.6-50 MG PO TABS
1.0000 | ORAL_TABLET | Freq: Every day | ORAL | Status: DC
Start: 2014-04-02 — End: 2014-04-06
  Administered 2014-04-02 – 2014-04-05 (×4): 1 via ORAL
  Filled 2014-04-02 (×5): qty 1

## 2014-04-02 MED ORDER — LEVALBUTEROL HCL 0.63 MG/3ML IN NEBU
0.6300 mg | INHALATION_SOLUTION | Freq: Three times a day (TID) | RESPIRATORY_TRACT | Status: DC
  Administered 2014-04-03 – 2014-04-05 (×6): 0.63 mg via RESPIRATORY_TRACT
  Filled 2014-04-02 (×14): qty 3

## 2014-04-02 MED ORDER — DIPHENHYDRAMINE HCL 12.5 MG/5ML PO ELIX
12.5000 mg | ORAL_SOLUTION | Freq: Four times a day (QID) | ORAL | Status: DC | PRN
  Filled 2014-04-02: qty 5

## 2014-04-02 MED ORDER — ACETAMINOPHEN 10 MG/ML IV SOLN
INTRAVENOUS | Status: AC
Start: 2014-04-02 — End: 2014-04-02
  Filled 2014-04-02: qty 100

## 2014-04-02 MED ORDER — FENTANYL CITRATE 0.05 MG/ML IJ SOLN
25.0000 ug | INTRAMUSCULAR | Status: DC | PRN
  Administered 2014-04-02 (×2): 50 ug via INTRAVENOUS

## 2014-04-02 MED ORDER — LEVALBUTEROL HCL 0.63 MG/3ML IN NEBU: 0.6300 mg | INHALATION_SOLUTION | Freq: Four times a day (QID) | RESPIRATORY_TRACT | Status: DC | PRN

## 2014-04-02 MED ORDER — HYDRALAZINE HCL 20 MG/ML IJ SOLN
10.0000 mg | Freq: Four times a day (QID) | INTRAMUSCULAR | Status: DC | PRN
  Administered 2014-04-03: 10 mg via INTRAVENOUS
  Filled 2014-04-02: qty 1

## 2014-04-02 MED ORDER — POTASSIUM CHLORIDE 10 MEQ/50ML IV SOLN
10.0000 meq | Freq: Every day | INTRAVENOUS | Status: DC | PRN
  Administered 2014-04-03 – 2014-04-05 (×9): 10 meq via INTRAVENOUS
  Filled 2014-04-02 (×9): qty 50

## 2014-04-02 MED ORDER — METFORMIN HCL 500 MG PO TABS
500.0000 mg | ORAL_TABLET | Freq: Two times a day (BID) | ORAL | Status: DC
  Administered 2014-04-03 – 2014-04-06 (×7): 500 mg via ORAL
  Filled 2014-04-02 (×9): qty 1

## 2014-04-02 MED ORDER — CEFUROXIME SODIUM 1.5 G IJ SOLR
1.5000 g | Freq: Two times a day (BID) | INTRAMUSCULAR | Status: AC
  Administered 2014-04-02 – 2014-04-03 (×2): 1.5 g via INTRAVENOUS
  Filled 2014-04-02 (×2): qty 1.5

## 2014-04-02 SURGICAL SUPPLY — 99 items
ADH SKN CLS APL DERMABOND .7 (GAUZE/BANDAGES/DRESSINGS) ×1
APL SKNCLS STERI-STRIP NONHPOA (GAUZE/BANDAGES/DRESSINGS) ×1
BAG SPEC RTRVL LRG 6X4 10 (ENDOMECHANICALS) ×1
BENZOIN TINCTURE PRP APPL 2/3 (GAUZE/BANDAGES/DRESSINGS) ×3 IMPLANT
CANISTER SUCTION 2500CC (MISCELLANEOUS) ×4 IMPLANT
CATH KIT ON Q 5IN SLV (PAIN MANAGEMENT) ×2 IMPLANT
CATH THORACIC 28FR (CATHETERS) ×2 IMPLANT
CATH THORACIC 36FR (CATHETERS) IMPLANT
CATH THORACIC 36FR RT ANG (CATHETERS) IMPLANT
CLIP TI MEDIUM 6 (CLIP) ×3 IMPLANT
CLOSURE STERI-STRIP 1/2X4 (GAUZE/BANDAGES/DRESSINGS) ×1
CLSR STERI-STRIP ANTIMIC 1/2X4 (GAUZE/BANDAGES/DRESSINGS) ×1 IMPLANT
CONN ST 1/4X3/8  BEN (MISCELLANEOUS) ×2
CONN ST 1/4X3/8 BEN (MISCELLANEOUS) IMPLANT
CONN Y 3/8X3/8X3/8  BEN (MISCELLANEOUS)
CONN Y 3/8X3/8X3/8 BEN (MISCELLANEOUS) IMPLANT
CONT SPEC 4OZ CLIKSEAL STRL BL (MISCELLANEOUS) ×6 IMPLANT
COVER SURGICAL LIGHT HANDLE (MISCELLANEOUS) ×3 IMPLANT
DERMABOND ADVANCED (GAUZE/BANDAGES/DRESSINGS) ×2
DERMABOND ADVANCED .7 DNX12 (GAUZE/BANDAGES/DRESSINGS) IMPLANT
DRAIN CHANNEL 28F RND 3/8 FF (WOUND CARE) ×2 IMPLANT
DRAIN CHANNEL 32F RND 10.7 FF (WOUND CARE) IMPLANT
DRAPE LAPAROSCOPIC ABDOMINAL (DRAPES) ×3 IMPLANT
DRAPE WARM FLUID 44X44 (DRAPE) ×1 IMPLANT
ELECT BLADE 4.0 EZ CLEAN MEGAD (MISCELLANEOUS) ×3
ELECT BLADE 6.5 EXT (BLADE) ×3 IMPLANT
ELECT CAUTERY BLADE 6.4 (BLADE) ×2 IMPLANT
ELECT REM PT RETURN 9FT ADLT (ELECTROSURGICAL) ×3
ELECTRODE BLDE 4.0 EZ CLN MEGD (MISCELLANEOUS) IMPLANT
ELECTRODE REM PT RTRN 9FT ADLT (ELECTROSURGICAL) ×1 IMPLANT
GAUZE SPONGE 4X4 12PLY STRL (GAUZE/BANDAGES/DRESSINGS) ×3 IMPLANT
GLOVE BIO SURGEON STRL SZ 6.5 (GLOVE) ×1 IMPLANT
GLOVE BIO SURGEONS STRL SZ 6.5 (GLOVE) ×1
GLOVE SURG SIGNA 7.5 PF LTX (GLOVE) ×6 IMPLANT
GLOVE SURG SS PI 7.0 STRL IVOR (GLOVE) ×14 IMPLANT
GOWN STRL REUS W/ TWL LRG LVL3 (GOWN DISPOSABLE) ×2 IMPLANT
GOWN STRL REUS W/ TWL XL LVL3 (GOWN DISPOSABLE) ×2 IMPLANT
GOWN STRL REUS W/TWL LRG LVL3 (GOWN DISPOSABLE) ×3
GOWN STRL REUS W/TWL XL LVL3 (GOWN DISPOSABLE) ×9
HANDLE STAPLE ENDO GIA SHORT (STAPLE) ×2
HEMOSTAT SURGICEL 2X14 (HEMOSTASIS) IMPLANT
KIT BASIN OR (CUSTOM PROCEDURE TRAY) ×3 IMPLANT
KIT ROOM TURNOVER OR (KITS) ×3 IMPLANT
KIT SUCTION CATH 14FR (SUCTIONS) ×3 IMPLANT
LIQUID BAND (GAUZE/BANDAGES/DRESSINGS) ×2 IMPLANT
NS IRRIG 1000ML POUR BTL (IV SOLUTION) ×10 IMPLANT
PACK CHEST (CUSTOM PROCEDURE TRAY) ×3 IMPLANT
PAD ARMBOARD 7.5X6 YLW CONV (MISCELLANEOUS) ×10 IMPLANT
PENCIL BUTTON HOLSTER BLD 10FT (ELECTRODE) ×2 IMPLANT
POUCH ENDO CATCH II 15MM (MISCELLANEOUS) IMPLANT
POUCH SPECIMEN RETRIEVAL 10MM (ENDOMECHANICALS) ×2 IMPLANT
RELOAD EGIA 45 MED/THCK PURPLE (STAPLE) ×4 IMPLANT
RELOAD EGIA 60 MED/THCK PURPLE (STAPLE) ×6 IMPLANT
RELOAD EGIA TRIS TAN 45 CVD (STAPLE) ×3 IMPLANT
RELOAD STAPLE 45 TAN MED CVD (STAPLE) IMPLANT
RELOAD STAPLE 60 MED/THCK ART (STAPLE) IMPLANT
SCISSORS ENDO CVD 5DCS (MISCELLANEOUS) IMPLANT
SEALANT PROGEL (MISCELLANEOUS) IMPLANT
SEALANT SURG COSEAL 4ML (VASCULAR PRODUCTS) IMPLANT
SEALANT SURG COSEAL 8ML (VASCULAR PRODUCTS) IMPLANT
SOLUTION ANTI FOG 6CC (MISCELLANEOUS) ×3 IMPLANT
SPECIMEN JAR MEDIUM (MISCELLANEOUS) ×3 IMPLANT
SPONGE GAUZE 4X4 12PLY STER LF (GAUZE/BANDAGES/DRESSINGS) ×2 IMPLANT
SPONGE INTESTINAL PEANUT (DISPOSABLE) ×4 IMPLANT
STAPLER ENDO GIA 12 SHRT THIN (STAPLE) IMPLANT
STAPLER ENDO GIA 12MM SHORT (STAPLE) ×1 IMPLANT
SUT PROLENE 4 0 RB 1 (SUTURE)
SUT PROLENE 4-0 RB1 .5 CRCL 36 (SUTURE) IMPLANT
SUT SILK  1 MH (SUTURE) ×4
SUT SILK 1 MH (SUTURE) ×1 IMPLANT
SUT SILK 1 TIES 10X30 (SUTURE) ×3 IMPLANT
SUT SILK 2 0 SH (SUTURE) IMPLANT
SUT SILK 2 0SH CR/8 30 (SUTURE) IMPLANT
SUT SILK 3 0 SH 30 (SUTURE) IMPLANT
SUT SILK 3 0SH CR/8 30 (SUTURE) ×2 IMPLANT
SUT VIC AB 0 CTX 27 (SUTURE) IMPLANT
SUT VIC AB 1 CTX 27 (SUTURE) ×2 IMPLANT
SUT VIC AB 2-0 CT1 27 (SUTURE)
SUT VIC AB 2-0 CT1 TAPERPNT 27 (SUTURE) IMPLANT
SUT VIC AB 2-0 CTX 36 (SUTURE) ×2 IMPLANT
SUT VIC AB 3-0 MH 27 (SUTURE) IMPLANT
SUT VIC AB 3-0 SH 27 (SUTURE)
SUT VIC AB 3-0 SH 27X BRD (SUTURE) IMPLANT
SUT VIC AB 3-0 X1 27 (SUTURE) ×4 IMPLANT
SUT VICRYL 0 UR6 27IN ABS (SUTURE) ×6 IMPLANT
SUT VICRYL 2 TP 1 (SUTURE) IMPLANT
SWAB COLLECTION DEVICE MRSA (MISCELLANEOUS) IMPLANT
SYSTEM SAHARA CHEST DRAIN ATS (WOUND CARE) ×3 IMPLANT
TAPE CLOTH SURG 4X10 WHT LF (GAUZE/BANDAGES/DRESSINGS) ×2 IMPLANT
TIP APPLICATOR SPRAY EXTEND 16 (VASCULAR PRODUCTS) IMPLANT
TOWEL OR 17X24 6PK STRL BLUE (TOWEL DISPOSABLE) ×3 IMPLANT
TOWEL OR 17X26 10 PK STRL BLUE (TOWEL DISPOSABLE) ×6 IMPLANT
TRAP SPECIMEN MUCOUS 40CC (MISCELLANEOUS) IMPLANT
TRAY FOLEY CATH 16FRSI W/METER (SET/KITS/TRAYS/PACK) ×3 IMPLANT
TROCAR XCEL BLADELESS 5X75MML (TROCAR) ×3 IMPLANT
TROCAR XCEL NON-BLD 5MMX100MML (ENDOMECHANICALS) IMPLANT
TUBE ANAEROBIC SPECIMEN COL (MISCELLANEOUS) IMPLANT
TUNNELER SHEATH ON-Q 11GX8 DSP (PAIN MANAGEMENT) ×2 IMPLANT
WATER STERILE IRR 1000ML POUR (IV SOLUTION) ×6 IMPLANT

## 2014-04-02 NOTE — Interval H&P Note (Signed)
History and Physical Interval Note:  04/02/2014 7:23 AM  Gwendolyn Miller  has presented today for surgery, with the diagnosis of LLL NODULE  The various methods of treatment have been discussed with the patient and family. After consideration of risks, benefits and other options for treatment, the patient has consented to  Procedure(s): LEFT VIDEO ASSISTED THORACOSCOPY (VATS)/LEFT LOWER LOBE SEGMENTECTOMY (Left) as a surgical intervention .  The patient's history has been reviewed, patient examined, no change in status, stable for surgery.  I have reviewed the patient's chart and labs.  Questions were answered to the patient's satisfaction.     Sherleen Pangborn C

## 2014-04-02 NOTE — Anesthesia Postprocedure Evaluation (Signed)
  Anesthesia Post-op Note  Patient: Gwendolyn Miller  Procedure(s) Performed: Procedure(s): LEFT VIDEO ASSISTED THORACOSCOPY (VATS)/LEFT LOWER LOBE SEGMENTECTOMY (Left) LYMPH NODE DISSECTION (Left)  Patient Location: PACU  Anesthesia Type:General  Level of Consciousness: awake  Airway and Oxygen Therapy: Patient Spontanous Breathing  Post-op Pain: mild  Post-op Assessment: Post-op Vital signs reviewed, Patient's Cardiovascular Status Stable, Respiratory Function Stable, Patent Airway, No signs of Nausea or vomiting and Pain level controlled  Post-op Vital Signs: Reviewed and stable  Last Vitals:  Filed Vitals:   04/02/14 1445  BP:   Pulse: 83  Temp:   Resp:     Complications: No apparent anesthesia complications

## 2014-04-02 NOTE — Transfer of Care (Signed)
Immediate Anesthesia Transfer of Care Note  Patient: Gwendolyn Miller  Procedure(s) Performed: Procedure(s): LEFT VIDEO ASSISTED THORACOSCOPY (VATS)/LEFT LOWER LOBE SEGMENTECTOMY (Left) LYMPH NODE DISSECTION (Left)  Patient Location: PACU  Anesthesia Type:General  Level of Consciousness: awake, alert  and oriented  Airway & Oxygen Therapy: Patient Spontanous Breathing and Patient connected to nasal cannula oxygen  Post-op Assessment: Report given to RN, Post -op Vital signs reviewed and stable and Patient moving all extremities X 4  Post vital signs: Reviewed and stable  Last Vitals:  Filed Vitals:   04/02/14 0620  BP: 196/82  Pulse: 70  Temp: 36.7 C  Resp: 20    Complications: No apparent anesthesia complications

## 2014-04-02 NOTE — Anesthesia Preprocedure Evaluation (Addendum)
Anesthesia Evaluation  Patient identified by MRN, date of birth, ID band Patient awake    Reviewed: Allergy & Precautions, NPO status , Patient's Chart, lab work & pertinent test results, reviewed documented beta blocker date and time   History of Anesthesia Complications Negative for: history of anesthetic complications  Airway Mallampati: II  TM Distance: >3 FB Neck ROM: Full    Dental  (+) Dental Advisory Given, Edentulous Upper, Edentulous Lower   Pulmonary neg shortness of breath, neg sleep apnea, COPD COPD inhaler, neg recent URI, former smoker,  breath sounds clear to auscultation        Cardiovascular hypertension, Pt. on medications and Pt. on home beta blockers - angina- Past MI and - CHF - dysrhythmias Rhythm:Regular     Neuro/Psych negative neurological ROS  negative psych ROS   GI/Hepatic GERD-  Medicated and Controlled,  Endo/Other  diabetes, Type 2, Oral Hypoglycemic Agents  Renal/GU      Musculoskeletal  (+) Arthritis -,   Abdominal   Peds  Hematology  (+) anemia ,   Anesthesia Other Findings   Reproductive/Obstetrics                           Anesthesia Physical Anesthesia Plan  ASA: III  Anesthesia Plan: General   Post-op Pain Management:    Induction: Intravenous  Airway Management Planned: Double Lumen EBT  Additional Equipment: Arterial line and CVP  Intra-op Plan:   Post-operative Plan: Extubation in OR and Possible Post-op intubation/ventilation  Informed Consent: I have reviewed the patients History and Physical, chart, labs and discussed the procedure including the risks, benefits and alternatives for the proposed anesthesia with the patient or authorized representative who has indicated his/her understanding and acceptance.   Dental advisory given  Plan Discussed with: CRNA, Anesthesiologist and Surgeon  Anesthesia Plan Comments:         Anesthesia Quick Evaluation

## 2014-04-02 NOTE — Anesthesia Procedure Notes (Addendum)
Procedure Name: Intubation Date/Time: 04/02/2014 7:55 AM Performed by: Garrison Columbus T Pre-anesthesia Checklist: Patient identified, Emergency Drugs available, Suction available and Patient being monitored Patient Re-evaluated:Patient Re-evaluated prior to inductionOxygen Delivery Method: Circle system utilized Preoxygenation: Pre-oxygenation with 100% oxygen Intubation Type: IV induction Ventilation: Mask ventilation without difficulty and Oral airway inserted - appropriate to patient size Laryngoscope Size: Mac and 3 Grade View: Grade I Endobronchial tube: Left, Double lumen EBT, EBT position confirmed by fiberoptic bronchoscope and EBT position confirmed by auscultation and 37 Fr Number of attempts: 1 Airway Equipment and Method: Fiberoptic brochoscope,  Stylet,  Oral airway and LTA kit utilized Placement Confirmation: ETT inserted through vocal cords under direct vision,  positive ETCO2 and breath sounds checked- equal and bilateral Tube secured with: Tape Dental Injury: Teeth and Oropharynx as per pre-operative assessment  Comments: DLT placed by Ova Freshwater, SRNA under full supervision by CRNA and MD.

## 2014-04-02 NOTE — Brief Op Note (Addendum)
04/02/2014  10:11 AM  PATIENT:  Gwendolyn Miller  81 y.o. female  PRE-OPERATIVE DIAGNOSIS:   LEFT LOWER LOBE LUNG NODULE  POST-OPERATIVE DIAGNOSIS:  SQUAMOUS CELL CARCINOMA LEFT LOWER LOBE- Clinical Stage IA  PROCEDURE:  Procedure(s):  LEFT VIDEO ASSISTED THORACOSCOPY (VATS)/ -Thoracoscopic Superior Segmentectomy Left Lower Lobe -Mediastinal Lymph node dissection -On-Q local anesthetic catheter placement   SURGEON:  Surgeon(s) and Role:    * Melrose Nakayama, MD - Primary  PHYSICIAN ASSISTANT: Ellwood Handler PA-C  ANESTHESIA:   general  EBL:  Total I/O In: -  Out: 575 [Urine:525; Blood:50]  BLOOD ADMINISTERED:none  DRAINS: 28 Straight, 28 Blake Left Chest   LOCAL MEDICATIONS USED:  MARCAINE     SPECIMEN:  Source of Specimen:  Superior Segment LLL, Lymph Nodes  DISPOSITION OF SPECIMEN:  PATHOLOGY  COUNTS:  YES  PLAN OF CARE: Admit to inpatient   PATIENT DISPOSITION:  PACU - hemodynamically stable.   Delay start of Pharmacological VTE agent (>24hrs) due to surgical blood loss or risk of bleeding: yes  2 cm nodule superior segment of LLL- frozen + squamous cell carcinoma

## 2014-04-02 NOTE — H&P (View-Only) (Signed)
PCP is No primary care provider on file. Referring Provider is Gardiner Rhyme, MD  Chief Complaint  Patient presents with  . Lung Lesion    eval and treat...CT/PET/PFT'S    HPI: 81 year old woman sent for consultation regarding a left lower lobe mass.  Gwendolyn Miller is an 81 year old woman with a history of tobacco abuse dating back from age 89 to December 2015. She was never a heavy smoker but did smoke up to a half a pack a day. Her total exposure is 60 pack years. She was in her usual state of health until December 2015. She developed a cough and general malaise. She denied any fevers or chills. Her cough was productive. She was hospitalized with pneumonia. Her chest x-ray showed a right middle lobe infiltrate. There also was a question of a left lung nodule. CT scan confirmed the right middle lobe infiltrate and there was a 2 cm nodule in the superior segment of the left lower lobe. She was only hospitalized for about 3 days and then was discharged home.  She had a PET CT done as an outpatient. It showed the left lower lobe nodule to be hypermetabolic. The SUV was 8.8. Per the radiologist's report the right middle lobe infiltrate had cleared.  She says she's been feeling well. She does most of the work around the house. Her husband does have some health problems. Her appetite is been poor. She says she gets full very quickly. She denies any weight loss over the past 3 or 6 months. She says that she can walk up a flight of stairs easily without shortness of breath. She says that she would get unsteady if she try to do 2 flights but not necessarily short of breath. She has not been having any chest pain, pressure, or tightness either at rest or with exertion. She uses a Spiriva inhaler and Symbicort on a daily basis. She says she does not have any wheezing. She is not using a rescue inhaler. She denies any cardiac history. She does have a history of hypertension.  Zubrod Score: At the time of  surgery this patient's most appropriate activity status/level should be described as: []     0    Normal activity, no symptoms [x]     1    Restricted in physical strenuous activity but ambulatory, able to do out light work []     2    Ambulatory and capable of self care, unable to do work activities, up and about >50 % of waking hours                              []     3    Only limited self care, in bed greater than 50% of waking hours []     4    Completely disabled, no self care, confined to bed or chair []     5    Moribund    Past Medical History  Diagnosis Date  . Hypertension   . Hyperlipidemia   . Anemia   . Vitamin D deficiency   . Osteoporosis   . Diabetes mellitus, type 2   . Asthma     mild    Past Surgical History  Procedure Laterality Date  . Knee arthroplasty    . Cataract extraction, bilateral      Family History  Problem Relation Age of Onset  . Heart failure Father     de  .  Dementia Sister     Social History History  Substance Use Topics  . Smoking status: Former Smoker -- 0.50 packs/day for 60 years    Types: Cigarettes    Quit date: 01/09/2014  . Smokeless tobacco: Never Used  . Alcohol Use: No    Current Outpatient Prescriptions  Medication Sig Dispense Refill  . alendronate (FOSAMAX) 70 MG tablet   0  . amLODipine (NORVASC) 10 MG tablet     . atenolol (TENORMIN) 100 MG tablet     . COSOPT PF 22.3-6.8 MG/ML SOLN   12  . gabapentin (NEURONTIN) 400 MG capsule   0  . guaiFENesin (MUCINEX) 600 MG 12 hr tablet   1  . ketorolac (ACULAR) 0.4 % SOLN     . latanoprost (XALATAN) 0.005 % ophthalmic solution   12  . oxyCODONE-acetaminophen (PERCOCET/ROXICET) 5-325 MG per tablet   0  . pramipexole (MIRAPEX) 0.125 MG tablet   3  . SPIRIVA HANDIHALER 18 MCG inhalation capsule   2  . SYMBICORT 160-4.5 MCG/ACT inhaler   3  . topiramate (TOPAMAX) 100 MG tablet      No current facility-administered medications for this visit.    Allergies  Allergen  Reactions  . Erythromycin Swelling and Nausea And Vomiting    Other reaction(s): Nausea And Vomiting  . Metaxalone Rash    Chest pain Chest pain  . Pramipexole Rash    abd pain  . Ace Inhibitors     Other reaction(s): Cough cough  . Zolpidem Nausea Only    Review of Systems  Constitutional: Positive for appetite change (early satiety). Negative for fever, chills, activity change and unexpected weight change (Denies weight loss).  Respiratory: Positive for cough (Pneumonia in December). Negative for shortness of breath.   Cardiovascular: Positive for leg swelling (When she eats a lot of salt). Negative for chest pain.  Neurological: Negative.   Hematological: Negative for adenopathy. Does not bruise/bleed easily.  All other systems reviewed and are negative.   BP 154/69 mmHg  Pulse 79  Resp 16  Ht 5\' 4"  (1.626 m)  Wt 152 lb (68.947 kg)  BMI 26.08 kg/m2  SpO2 98% Physical Exam  Constitutional: She is oriented to person, place, and time. She appears well-developed and well-nourished. No distress.  HENT:  Head: Normocephalic and atraumatic.  Eyes: EOM are normal.  Neck: Neck supple. No thyromegaly present.  Cardiovascular: Normal rate, normal heart sounds and intact distal pulses.   No murmur heard. Pulmonary/Chest: Effort normal and breath sounds normal. She has no wheezes. She has no rales.  Abdominal: Soft. There is no tenderness.  Musculoskeletal: She exhibits no edema.  Lymphadenopathy:    She has no cervical adenopathy.  Neurological: She is alert and oriented to person, place, and time. No cranial nerve deficit.  Skin: Skin is warm and dry.  Vitals reviewed.    Diagnostic Tests: CT chest [facility] 01/24/2014  Impression Consolidation in the right middle lobe is more concerning for pneumonia, less likely neoplasm. Opacity in left midlung is more concerning for neoplasm, less likely pneumonia 4 mm left lung nodule  PET/CT [facility]  02/22/2014  Impression 1. Hypermetabolic left lower lobe nodule worrisome for neoplasm. 2. Interval clearing of right middle lobe infiltrate.  Pulmonary function testing FVC 1.42 (67%) FEV1 1.22 (74%) TLC 64% of predicted VC 73% predicted Diffusion capacity 15.1 (85%) Minimal change with bronchodilator (patient taking once daily bronchodilator baseline)   Impression: 81 year old woman with a 2 cm mass in the  superior segment of the left lower lobe that is hypermetabolic by PET with an SUV of 8.8. There is no evidence of regional or distant metastases.  I personally reviewed the chest x-ray and CT that she brought along with her on disc. I also reviewed her pulmonary function testing. Unfortunately the PET images are not available to me. I based my recommendation on my interpretation of the CT as well as the radiologist's interpretation of the PET. This is highly suspicious for a new primary bronchogenic carcinoma.   I discussed our options for diagnosis and treatment. This is a very peripheral lesion that would be hard to access bronchoscopically. It is amenable to CT-guided percutaneous biopsy. We discussed the advantages and disadvantages of surgical resection versus a CT-guided biopsy. Given the highly suspicious nature of the lesion I think that if she is willing to undergo surgical resection that there is no need to get a CT-guided biopsy first. If she wished to be treated with radiation and/or chemotherapy to CT-guided biopsy would be indicated.  I discussed the proposed surgical procedure with Mr. and Mrs. Yerkes. We reviewed the general nature of the procedure, the incisions to be used, the need for general anesthesia, and the intraoperative decision making. They understand that we will plan to do a bronchoscopy, left video-assisted thoracoscopy and superior segmentectomy of the left lower lobe. This will be both diagnostic and therapeutic. Although she has a clinical stage IA lesion  they understand that additional therapy might be recommended based on pathologic findings at time of surgery. I reviewed the indications, risks, benefits, and alternatives. She understands the risks include, but are not limited to death, MI, stroke, DVT, PE, bleeding, possibly for transfusion, infection, prolonged air leak, cardiac arrhythmias, as well as the possibility of unforeseeable complications. They understand that no guarantee of cure can be given.  She accepts the risks of surgery and agrees to proceed.  Plan: Bronchoscopy, Left video-assisted thoracoscopy, left lower lobe superior segmentectomy on Monday February 22nd, 2016.  We will request PET/CT to be sent from [facility]. I also asked her to bring a copy of the PET on disc when she comes for surgery.  I spent 60 minutes with Gwendolyn Miller.D.

## 2014-04-03 ENCOUNTER — Encounter (HOSPITAL_COMMUNITY): Payer: Self-pay | Admitting: Thoracic Surgery (Cardiothoracic Vascular Surgery)

## 2014-04-03 ENCOUNTER — Inpatient Hospital Stay (HOSPITAL_COMMUNITY): Payer: Medicare Other

## 2014-04-03 LAB — GLUCOSE, CAPILLARY
GLUCOSE-CAPILLARY: 139 mg/dL — AB (ref 70–99)
GLUCOSE-CAPILLARY: 176 mg/dL — AB (ref 70–99)
GLUCOSE-CAPILLARY: 104 mg/dL — AB (ref 70–99)
Glucose-Capillary: 142 mg/dL — ABNORMAL HIGH (ref 70–99)

## 2014-04-03 LAB — BLOOD GAS, ARTERIAL
ACID-BASE DEFICIT: 1.1 mmol/L (ref 0.0–2.0)
Bicarbonate: 22.3 mEq/L (ref 20.0–24.0)
Drawn by: 36277
O2 CONTENT: 2 L/min
O2 SAT: 97.3 %
PO2 ART: 87.4 mmHg (ref 80.0–100.0)
Patient temperature: 98.6
TCO2: 23.3 mmol/L (ref 0–100)
pCO2 arterial: 31.9 mmHg — ABNORMAL LOW (ref 35.0–45.0)
pH, Arterial: 7.458 — ABNORMAL HIGH (ref 7.350–7.450)

## 2014-04-03 LAB — CBC
HCT: 36.5 % (ref 36.0–46.0)
Hemoglobin: 11.5 g/dL — ABNORMAL LOW (ref 12.0–15.0)
MCH: 22.3 pg — AB (ref 26.0–34.0)
MCHC: 31.5 g/dL (ref 30.0–36.0)
MCV: 70.7 fL — ABNORMAL LOW (ref 78.0–100.0)
PLATELETS: 178 10*3/uL (ref 150–400)
RBC: 5.16 MIL/uL — AB (ref 3.87–5.11)
RDW: 16.4 % — ABNORMAL HIGH (ref 11.5–15.5)
WBC: 7.5 10*3/uL (ref 4.0–10.5)

## 2014-04-03 LAB — BASIC METABOLIC PANEL
Anion gap: 7 (ref 5–15)
BUN: <5 mg/dL — ABNORMAL LOW (ref 6–23)
CO2: 24 mmol/L (ref 19–32)
Calcium: 8.5 mg/dL (ref 8.4–10.5)
Chloride: 106 mmol/L (ref 96–112)
Creatinine, Ser: 0.55 mg/dL (ref 0.50–1.10)
GFR, EST NON AFRICAN AMERICAN: 86 mL/min — AB (ref >90)
Glucose, Bld: 130 mg/dL — ABNORMAL HIGH (ref 70–99)
POTASSIUM: 3.4 mmol/L — AB (ref 3.5–5.1)
SODIUM: 137 mmol/L (ref 135–145)

## 2014-04-03 MED ORDER — ENOXAPARIN SODIUM 40 MG/0.4ML ~~LOC~~ SOLN
40.0000 mg | SUBCUTANEOUS | Status: DC
  Administered 2014-04-03 – 2014-04-06 (×4): 40 mg via SUBCUTANEOUS
  Filled 2014-04-03 (×4): qty 0.4

## 2014-04-03 NOTE — Progress Notes (Signed)
PCA reduced fentanyl tubing changed due to leaking connection. Austin Miles, RN witnessed pca tubing wasted with approxiately 10 ml of reduced fentanyl.

## 2014-04-03 NOTE — Op Note (Signed)
Gwendolyn Miller, Gwendolyn Miller             ACCOUNT NO.:  [id number]  MEDICAL RECORD NO.:  [id number]  LOCATION:  3S06C                        FACILITY:  [facility]  PHYSICIAN:  Revonda Standard. Roxan Hockey, M.D.DATE OF BIRTH:  01-Dec-1931  DATE OF PROCEDURE:  04/02/2014 DATE OF DISCHARGE:                              OPERATIVE REPORT   PREOPERATIVE DIAGNOSIS:  Left lower lobe lung nodule.  POSTOPERATIVE DIAGNOSIS:  Squamous cell carcinoma left lower lobe, clinical stage IA.  PROCEDURE:   Left video-assisted thoracoscopy Thoracoscopic superior segmentectomy of the left lower lobe Mediastinal lymph node dissection, On-Q local anesthetic catheter placement.  SURGEON:  Revonda Standard. Roxan Hockey, M.D.  ASSISTANT:  Ellwood Handler, PA-C.  ANESTHESIA:  General.  FINDINGS:  Two cm mass in superior segment left lower lobe.  Frozen section showed squamous cell carcinoma.  Margins free of tumor.  Multiple enlarged, but otherwise benign-appearing lymph nodes.  CLINICAL NOTE:  Gwendolyn Miller is an 81 year old woman with a long history of tobacco abuse.  She developed a cough back in December and was hospitalized with pneumonia of the right middle lobe.  There was a questionable left lung nodule.  She had a CT scan which showed a right middle lobe infiltrate and a 2 cm nodule in the left lower lobe.  A PET-CT was done as an outpatient.  It showed clearing of the right middle lobe infiltrate.  The left lower lobe nodule was hypermetabolic and this was felt to be highly suspicious for a primary lung cancer. She was advised to undergo surgical resection for definitive diagnosis and treatment.  The indications, risks, benefits and alternatives were discussed in detail with the patient.  She understood and accepted the risks and agreed to proceed.  OPERATIVE NOTE:  Gwendolyn Miller was brought to the operating room on April 02, 2014. Anesthesia had placed a central line and arterial blood pressure monitoring line in the  preoperative holding area.  In the operating room, she was given intravenous antibiotics.  She was anesthetized and intubated with a double-lumen endotracheal tube.  A Foley catheter was placed and sequential compression devices were placed on the calves for DVT prophylaxis.  She was placed in a right lateral decubitus position and the left chest was prepped and draped in the usual sterile fashion.  Single lung ventilation of the right lung was initiated and was tolerated well throughout the procedure.  An incision was made in the midaxillary line in the seventh intercostal Space. It was carried through the skin and subcutaneous tissue.  The chest entered bluntly using a hemostat.  A port was inserted and a 5 mm thoracoscope was advanced into the chest.  There was no pleural effusion.  There was a relatively complete fissure .  A small, 4 cm, working incision was made in the fourth interspace anterolaterally.  No rib spreading was performed during the procedure.  The superior segment of the lower lobe was grasped and the nodule was clearly palpable.  The fissure was essentially complete.  There was a node overlying the pulmonary artery in the fissure.  This was dissected off carefully using electrocautery and blunt dissection.  This node was sent for pathology as were all other nodes that were  encountered during the dissection. The dissection of the pulmonary artery was carried posteriorly.  The takeoff of the superior segmental artery was identified.  There actually were 2 parallel branches supplying the superior segment.  These were clearly separate from the basilar segmental branches. Additional nodes were encountered more laterally.  The inferior ligament was divided and level 9 nodes were sent for pathology.  The pleural reflection was divided posteriorly as well as anteriorly.  By this point, the nodes have been completely cleared posterior to the superior segmental arterial  branches.  These vessels then were encircled and divided with an endoscopic vascular stapler. The superior segmental bronchus was now easily identifiable and nodes adjacent to it were taken as a separate specimens.  The bronchus was encircled and then an endoscopic GIA stapler with a purple cartridge was placed across the bronchus and closed, but not fired.  A test inflation showed good aeration of the upper lobe and the basilar segments of the lower lobe.  The stapler was fired transecting the bronchus.  The superior segmentectomy was completed with sequential firings of an endoscopic GIA stapler again using the purple cartridges for the parenchymal division.  The specimen was placed into an endoscopic retrieval bag and removed through the incision.  It was sent to pathology for frozen section which returned showing non small-cell carcinoma (squamous cell). The bronchial margin was clear.  The subcarinal space was explored and the nodes taken.  The AP window was inspected.  There were 2 relatively large, but otherwise benign- appearing nodes in the aortopulmonary window.  These were taken as well. The chest was copiously irrigated with warm saline.  A test inflation to 30 cm of water pressure showed no air leak from the bronchial stump.  An On-Q local anesthetic catheter was placed through a separate stab incision posteriorly and tunneled into a subpleural location.  It was primed with 5 mL of 0.25% Marcaine and then attached to the bulb.  A 28-French chest tube and 28-French Blake drain were placed through separate subcostal incisions.  The port incision was used for 1 of these tubes.  Both were secured with #1 silk sutures.  The left lung was reinflated.  The small utility incision was closed with a #1 Vicryl fascial suture followed by a 2-0 Vicryl subcutaneous suture and a 3-0 Vicryl subcuticular suture. All sponge, needle, and instrument counts were correct at the end of procedure.   There were no intraoperative complications and the patient was taken from the operating room to the postanesthetic care unit in good condition.     Revonda Standard Roxan Hockey, M.D.     SCH/MEDQ  D:  04/02/2014  T:  04/03/2014  Job:  [id number]

## 2014-04-03 NOTE — Progress Notes (Signed)
Utilization review completed.  

## 2014-04-03 NOTE — Progress Notes (Addendum)
[address]       [place] [address]             [phone]          1 Day Post-Op Procedure(s) (LRB): LEFT VIDEO ASSISTED THORACOSCOPY (VATS)/LEFT LOWER LOBE SEGMENTECTOMY (Left) LYMPH NODE DISSECTION (Left)  Subjective: Having a fair amount of pain this am after getting OOB in chair.  Breathing stable.  Diet being advanced.   Objective: Vital signs in last 24 hours: Patient Vitals for the past 24 hrs:  BP Temp Temp src Pulse Resp SpO2 Height Weight  04/03/14 0730 136/66 mmHg 98.6 F (37 C) Oral 87 18 100 % - -  04/03/14 0600 - - - 92 20 99 % - -  04/03/14 0430 126/62 mmHg - - 88 (!) 26 99 % - -  04/03/14 0400 - - - 86 17 98 % - -  04/03/14 0332 (!) 163/77 mmHg - - 85 (!) 24 98 % - -  04/03/14 0329 - 98.6 F (37 C) Oral - - - - -  04/03/14 0215 - - - 83 16 98 % - -  04/03/14 0000 - - - - (!) 21 100 % - -  04/02/14 2323 136/66 mmHg - - 78 20 100 % - -  04/02/14 2320 - 98.3 F (36.8 C) Oral - - - - -  04/02/14 2001 - - - - - 100 % - -  04/02/14 1942 - 98.5 F (36.9 C) Oral - - - - -  04/02/14 1930 (!) 153/81 mmHg - - 90 20 100 % - -  04/02/14 1545 (!) 152/75 mmHg - - 83 (!) 22 100 % - -  04/02/14 1544 - - - - (!) 22 100 % - -  04/02/14 1543 - - - - (!) 24 100 % - -  04/02/14 1506 (!) 159/76 mmHg 98.7 F (37.1 C) Oral 83 16 100 % 5\' 4"  (1.626 m) 152 lb (68.947 kg)  04/02/14 1445 - - - 83 - 100 % - -  04/02/14 1430 - - - 80 (!) 21 100 % - -  04/02/14 1425 (!) 151/75 mmHg - - 80 (!) 21 100 % - -  04/02/14 1415 - - - 81 (!) 26 100 % - -  04/02/14 1410 (!) 152/85 mmHg - - 85 (!) 22 100 % - -  04/02/14 1400 - - - 82 (!) 23 100 % - -  04/02/14 1357 (!) 152/70 mmHg - - 81 (!) 24 100 % - -  04/02/14 1350 - - - 83 (!) 21 100 % - -  04/02/14 1345 - - - 83 (!) 21 100 % - -  04/02/14 1340 (!) 144/74 mmHg - - 78 20 100 % - -  04/02/14 1330 - - - 78 (!) 21 100 % - -  04/02/14 1320 (!) 146/68 mmHg - - 80 17 100 % - -  04/02/14 1315 - - - 76 20 100 % -  -  04/02/14 1310 - - - 76 18 100 % - -  04/02/14 1300 - - - 75 17 100 % - -  04/02/14 1255 140/67 mmHg 97.3 F (36.3 C) - 74 (!) 23 100 % - -  04/02/14 1245 - - - 78 (!) 22 100 % - -  04/02/14 1240 138/67 mmHg - - 74 20 100 % - -  04/02/14 1230 - - - 80 20 100 % - -  04/02/14 1225 133/66 mmHg - - 71 20 100 % - -  04/02/14 1215 - - - 71 19 100 % - -  04/02/14 1200 - - - 70 18 99 % - -  04/02/14 1155 129/66 mmHg - - 69 17 99 % - -  04/02/14 1150 - - - 69 17 99 % - -  04/02/14 1140 124/73 mmHg - - 67 15 99 % - -  04/02/14 1130 - - - 71 19 100 % - -  04/02/14 1124 (!) 151/69 mmHg - - 73 (!) 21 100 % - -  04/02/14 1115 - - - 77 18 100 % - -  04/02/14 1110 (!) 151/73 mmHg - - 71 (!) 22 100 % - -  04/02/14 1100 - - - 73 20 100 % - -  04/02/14 1055 (!) 145/74 mmHg - - 72 20 100 % - -  04/02/14 1045 - 97.5 F (36.4 C) - 82 (!) 21 100 % - -  04/02/14 1040 (!) 153/75 mmHg - - 86 (!) 23 100 % - -   Current Weight  04/02/14 152 lb (68.947 kg)   CBGs 141-124-130   Intake/Output from previous day: 02/22 0701 - 02/23 0700 In: 2660 [P.O.:360; I.V.:2200; IV Piggyback:100] Out: 3115 [Urine:2725; Blood:50; Chest Tube:340]    PHYSICAL EXAM:  Heart: RRR Lungs: Diminished BS in bases Wound: Dressed and dry, some serous drainage around On-Q Chest tube: No air leak    Lab Results: CBC: Recent Labs  04/03/14 0415  WBC 7.5  HGB 11.5*  HCT 36.5  PLT 178   BMET:  Recent Labs  04/03/14 0415  NA 137  K 3.4*  CL 106  CO2 24  GLUCOSE 130*  BUN <5*  CREATININE 0.55  CALCIUM 8.5    PT/INR: No results for input(s): LABPROT, INR in the last 72 hours.  CXR: No obvious pneumothorax   Assessment/Plan: S/P Procedure(s) (LRB): LEFT VIDEO ASSISTED THORACOSCOPY (VATS)/LEFT LOWER LOBE SEGMENTECTOMY (Left) LYMPH NODE DISSECTION (Left) CXR stable, CT with no air leak.  Will place CT to water seal. Decrease IVF, increase pulm toilet, mobilize as tolerated. D/c A-line, leave Foley 1  more day for volume measurement, routine POD #1 progression. HTN- Home meds restarted.  Will monitor. DM- po meds resumed. Watch CBGs. Hypokalemia- getting runs of K+ at present. Follow labs.   LOS: 1 day    COLLINS,GINA H 04/03/2014  Patient seen and examined, agree with above Will add enoxaparin to SCD

## 2014-04-04 ENCOUNTER — Inpatient Hospital Stay (HOSPITAL_COMMUNITY): Payer: Medicare Other

## 2014-04-04 LAB — CBC
HCT: 36.1 % (ref 36.0–46.0)
HEMOGLOBIN: 11.3 g/dL — AB (ref 12.0–15.0)
MCH: 22.5 pg — ABNORMAL LOW (ref 26.0–34.0)
MCHC: 31.3 g/dL (ref 30.0–36.0)
MCV: 71.9 fL — ABNORMAL LOW (ref 78.0–100.0)
Platelets: 172 10*3/uL (ref 150–400)
RBC: 5.02 MIL/uL (ref 3.87–5.11)
RDW: 16.7 % — ABNORMAL HIGH (ref 11.5–15.5)
WBC: 7.6 10*3/uL (ref 4.0–10.5)

## 2014-04-04 LAB — GLUCOSE, CAPILLARY
GLUCOSE-CAPILLARY: 102 mg/dL — AB (ref 70–99)
GLUCOSE-CAPILLARY: 124 mg/dL — AB (ref 70–99)
GLUCOSE-CAPILLARY: 165 mg/dL — AB (ref 70–99)
GLUCOSE-CAPILLARY: 106 mg/dL — AB (ref 70–99)

## 2014-04-04 LAB — COMPREHENSIVE METABOLIC PANEL
ALT: 11 U/L (ref 0–35)
ANION GAP: 6 (ref 5–15)
AST: 16 U/L (ref 0–37)
Albumin: 2.9 g/dL — ABNORMAL LOW (ref 3.5–5.2)
Alkaline Phosphatase: 43 U/L (ref 39–117)
BUN: 8 mg/dL (ref 6–23)
CO2: 22 mmol/L (ref 19–32)
Calcium: 8.7 mg/dL (ref 8.4–10.5)
Chloride: 108 mmol/L (ref 96–112)
Creatinine, Ser: 0.7 mg/dL (ref 0.50–1.10)
GFR calc Af Amer: >90 mL/min (ref >90)
GFR calc non Af Amer: 80 mL/min — ABNORMAL LOW (ref >90)
Glucose, Bld: 96 mg/dL (ref 70–99)
Potassium: 3.4 mmol/L — ABNORMAL LOW (ref 3.5–5.1)
Sodium: 136 mmol/L (ref 135–145)
Total Bilirubin: 0.9 mg/dL (ref 0.3–1.2)
Total Protein: 6 g/dL (ref 6.0–8.3)

## 2014-04-04 NOTE — Progress Notes (Addendum)
[address]       [place] [address]             [phone]          2 Days Post-Op Procedure(s) (LRB): LEFT VIDEO ASSISTED THORACOSCOPY (VATS)/LEFT LOWER LOBE SEGMENTECTOMY (Left) LYMPH NODE DISSECTION (Left)  Subjective: Pain better controlled this am.  Breathing stable.   Objective: Vital signs in last 24 hours: Patient Vitals for the past 24 hrs:  BP Temp Temp src Pulse Resp SpO2  04/04/14 0749 - - - 87 (!) 24 -  04/04/14 0329 - - - - 20 100 %  04/04/14 0319 - 98.5 F (36.9 C) Oral - - -  04/04/14 0000 - - - - (!) 29 100 %  04/03/14 2342 (!) 150/73 mmHg - - (!) 30 (!) 30 (!) 84 %  04/03/14 2338 - 98.9 F (37.2 C) Oral - - -  04/03/14 2028 - - - 98 (!) 23 97 %  04/03/14 1949 - - - - (!) 25 97 %  04/03/14 1939 135/62 mmHg - - 97 (!) 22 96 %  04/03/14 1938 - 98.7 F (37.1 C) Oral - - -  04/03/14 1636 137/64 mmHg - - 90 (!) 22 -  04/03/14 1521 - 99.2 F (37.3 C) Oral - - -  04/03/14 1213 - 98.6 F (37 C) Oral - - -  04/03/14 1210 (!) 162/79 mmHg - - 87 (!) 31 100 %  04/03/14 1200 - - - - 18 100 %   Current Weight  04/02/14 152 lb (68.947 kg)     Intake/Output from previous day: 02/23 0701 - 02/24 0700 In: 2284.2 [P.O.:600; I.V.:1534.2; IV Piggyback:150] Out: 2470 [Urine:2125; Chest Tube:345] CBGs 174-104-96   PHYSICAL EXAM:  Heart: RRR Lungs: Clear Wound: Clean and dry Chest tube: No air leak    Lab Results: CBC: Recent Labs  04/03/14 0415 04/04/14 0544  WBC 7.5 7.6  HGB 11.5* 11.3*  HCT 36.5 36.1  PLT 178 172   BMET:  Recent Labs  04/03/14 0415 04/04/14 0544  NA 137 136  K 3.4* 3.4*  CL 106 108  CO2 24 22  GLUCOSE 130* 96  BUN <5* 8  CREATININE 0.55 0.70  CALCIUM 8.5 8.7    PT/INR: No results for input(s): LABPROT, INR in the last 72 hours.  CXR: FINDINGS: There is no significant change in the volume of the left apical pneumothorax with 2 left chest tubes remaining. Left mild left basilar  atelectasis is present. The right lung is clear. The right IJ central venous line tip overlies the expected SVC -RA junction. Heart size is unchanged.  IMPRESSION: No change in volume of left pneumothorax with 2 left chest tubes Remaining.   Assessment/Plan: S/P Procedure(s) (LRB): LEFT VIDEO ASSISTED THORACOSCOPY (VATS)/LEFT LOWER LOBE SEGMENTECTOMY (Left) LYMPH NODE DISSECTION (Left) CXR with slightly increased left ptx, CT with no air leak, but drained around 345 ml over past 24 hrs.  Continue CT to water seal for now. KVO IVF, increase pulm toilet, mobilize as tolerated.Marland Kitchen HTN- Home meds restarted, BPs still a bit elevated at times. Continue to monitor. DM- CBGs generally stable. Back on home meds. Hypokalemia- replace K+.   LOS: 2 days    COLLINS,GINA H 04/04/2014  Patient seen and examined, agree with above She has a small pneumo but no air leak- will dc anterior CT today Keep posterior tube until drainage down  Remo Lipps C. Roxan Hockey, MD [facility] [phone]

## 2014-04-05 ENCOUNTER — Inpatient Hospital Stay (HOSPITAL_COMMUNITY): Payer: Medicare Other

## 2014-04-05 LAB — BASIC METABOLIC PANEL
Anion gap: 9 (ref 5–15)
BUN: 10 mg/dL (ref 6–23)
CALCIUM: 9.2 mg/dL (ref 8.4–10.5)
CO2: 21 mmol/L (ref 19–32)
Chloride: 107 mmol/L (ref 96–112)
Creatinine, Ser: 0.73 mg/dL (ref 0.50–1.10)
GFR calc Af Amer: >90 mL/min (ref >90)
GFR, EST NON AFRICAN AMERICAN: 79 mL/min — AB (ref >90)
GLUCOSE: 100 mg/dL — AB (ref 70–99)
Potassium: 3.7 mmol/L (ref 3.5–5.1)
Sodium: 137 mmol/L (ref 135–145)

## 2014-04-05 LAB — GLUCOSE, CAPILLARY
GLUCOSE-CAPILLARY: 111 mg/dL — AB (ref 70–99)
Glucose-Capillary: 102 mg/dL — ABNORMAL HIGH (ref 70–99)
Glucose-Capillary: 131 mg/dL — ABNORMAL HIGH (ref 70–99)
Glucose-Capillary: 115 mg/dL — ABNORMAL HIGH (ref 70–99)

## 2014-04-05 MED ORDER — LACTULOSE 10 GM/15ML PO SOLN
10.0000 g | Freq: Every day | ORAL | Status: DC | PRN
  Filled 2014-04-05: qty 15

## 2014-04-05 MED ORDER — NITROGLYCERIN 0.4 MG SL SUBL
SUBLINGUAL_TABLET | SUBLINGUAL | Status: AC
  Filled 2014-04-05: qty 1

## 2014-04-05 MED ORDER — NITROGLYCERIN 0.4 MG SL SUBL
0.4000 mg | SUBLINGUAL_TABLET | SUBLINGUAL | Status: DC | PRN
  Administered 2014-04-05: 0.4 mg via SUBLINGUAL

## 2014-04-05 MED ORDER — MAGNESIUM HYDROXIDE 400 MG/5ML PO SUSP
15.0000 mL | Freq: Every day | ORAL | Status: DC | PRN
  Administered 2014-04-05: 15 mL via ORAL
  Filled 2014-04-05: qty 30

## 2014-04-05 MED ORDER — LEVALBUTEROL HCL 0.63 MG/3ML IN NEBU: 0.6300 mg | INHALATION_SOLUTION | RESPIRATORY_TRACT | Status: DC | PRN

## 2014-04-05 NOTE — Progress Notes (Addendum)
Pt complaining of new onset left sided chest pain, 9 out of 10. Placed pt on 2L O2 nasal canula and performed 12-lead EKG performed per chest pain protocol. Gave 1 sublingual nitroglycerin tablet per chest pain protocol. Pt now denies pain. Paged MD to make aware, received orders for a repeat 12-lead EKG at 0700. Will continue to monitor.

## 2014-04-05 NOTE — Progress Notes (Addendum)
Pt c/o chest pain. Paged Physician on call using TCTS after hours answering service. Awaiting response.

## 2014-04-05 NOTE — Progress Notes (Signed)
Wasted 15 mL (150 mcg) fentanyl in sink. Witnessed by Katharina Caper RN.

## 2014-04-05 NOTE — Progress Notes (Signed)
Pt refused to get up and walk. She stated she "didn't feel good."

## 2014-04-05 NOTE — Discharge Summary (Signed)
[address]       [place] [address]             [phone]              Discharge Summary  Name: Gwendolyn Miller DOB: [birth year] 81 y.o. 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 Hypertension   . Hyperlipidemia   . Anemia   . Vitamin D deficiency   . Osteoporosis   . Diabetes mellitus, type 2   . Arthritis     Procedures: LEFT VIDEO ASSISTED THORACOSCOPY - 04/02/2014  LEFT LOWER LOBE SUPERIOR SEGMENTECTOMY  MEDIASTINAL LYMPH NODE DISSECTION     HPI:  The patient is a 81 y.o. female with a history of tobacco abuse dating back from age 32 to December 2015. She was never a heavy smoker but did smoke up to a half a pack a day. Her total exposure is 60 pack years. She was in her usual state of health until December 2015. She developed a cough and general malaise. She denied any fevers or chills. Her cough was productive. She was hospitalized with pneumonia. Her chest x-ray showed a right middle lobe infiltrate. There also was a question of a left lung nodule. CT scan confirmed the right middle lobe infiltrate and there was a 2 cm nodule in the superior segment of the left lower lobe. She was only hospitalized for about 3 days and then was discharged home.  She had a PET CT done as an outpatient. It showed the left lower lobe nodule to be hypermetabolic. The SUV was 8.8. Per the radiologist's report, the right middle lobe infiltrate had cleared.  She was referred to Dr. Roxan Hockey for thoracic surgical consultation. After review of her studies, he felt that the mass represented a primary bronchogenic carcinoma.  Because the lesion was so peripheral, it would difficult to access via bronchoscopy.  He discussed the possibility of CT  guided biopsy vs surgical resection, and the patient chose surgery.  All risks, benefits and alternatives of surgery were explained in detail, and the patient agreed to proceed.     Hospital Course:  The patient was admitted to [facility] on 04/02/2014.  The patient was taken to the operating room and underwent the above procedure.    The postoperative course has generally been uneventful.  Her chest tube output initially was high, but decreased over time.  Chest x-ray remained stable with only a small apical pneumothorax.  Chest tubes had no air leak, and were initially weaned to water seal and subsequently removed in a standard fashion.    The patient has otherwise progressed well postoperatively.  She is ambulating in the halls without difficulty and is tolerating a regular diet.  She has been started on her home diabetes and antihypertensive medications with adequate control. Incisions are healing well.  Follow up chest x-rays have remained stable. Final surgical pathology revealed invasive moderately differentiated squamous cell carcinoma (T2a, N0).  The patient has been evaluated on today's date and is stable for discharge home.     Recent vital signs:  Filed Vitals:   04/06/14 1539  BP: 120/61  Pulse: 95  Temp: 98.6 F (37 C)  Resp: 20    Recent laboratory studies:  CBC:No results for  input(s): WBC, HGB, HCT, PLT in the last 72 hours. BMET: No results for input(s): NA, K, CL, CO2, GLUCOSE, BUN, CREATININE, CALCIUM in the last 72 hours.  PT/INR: No results for input(s): LABPROT, INR in the last 72 hours.   Discharge Medications:     Medication List    TAKE these medications        alendronate 70 MG tablet  Commonly known as:  FOSAMAX  Take 70 mg by mouth once a week.     amLODipine 10 MG tablet  Commonly known as:  NORVASC  Take 10 mg by mouth daily.     aspirin EC 81 MG tablet  Take 81 mg by mouth daily.     atenolol 100 MG tablet  Commonly known as:  TENORMIN    Take 100 mg by mouth 2 (two) times daily.     COSOPT PF 22.3-6.8 MG/ML Soln  Generic drug:  Dorzolamide HCl-Timolol Mal PF  Place 1 drop into the left eye 2 (two) times daily.     gabapentin 400 MG capsule  Commonly known as:  NEURONTIN  Take 400-1,600 mg by mouth See admin instructions. Take 1 capsule every morning and 3-4 capsules by mouth every night at bedtime.     guaiFENesin 600 MG 12 hr tablet  Commonly known as:  MUCINEX  Take 600 mg by mouth daily as needed for cough or to loosen phlegm.     ketorolac 0.4 % Soln  Commonly known as:  ACULAR  Place 1 drop into the left eye 3 (three) times daily.     latanoprost 0.005 % ophthalmic solution  Commonly known as:  XALATAN  Place 1 drop into the left eye at bedtime.     metFORMIN 500 MG tablet  Commonly known as:  GLUCOPHAGE  Take 500 mg by mouth 2 (two) times daily with a meal.     omeprazole 40 MG capsule  Commonly known as:  PRILOSEC  Take 40 mg by mouth daily.     oxyCODONE-acetaminophen 5-325 MG per tablet  Commonly known as:  PERCOCET/ROXICET  Take 1 tablet by mouth 2 (two) times daily as needed for severe pain.     simvastatin 40 MG tablet  Commonly known as:  ZOCOR  Take 40 mg by mouth at bedtime.     SPIRIVA HANDIHALER 18 MCG inhalation capsule  Generic drug:  tiotropium  Place 18 mcg into inhaler and inhale daily.     SYMBICORT 160-4.5 MCG/ACT inhaler  Generic drug:  budesonide-formoterol  Inhale 2 puffs into the lungs 2 (two) times daily.     topiramate 100 MG tablet  Commonly known as:  TOPAMAX  Take 100 mg by mouth at bedtime.     traMADol 50 MG tablet  Commonly known as:  ULTRAM  Take 1-2 tablets (50-100 mg total) by mouth every 6 (six) hours as needed for moderate pain.     Vitamin D3 2000 UNITS Tabs  Take 1 tablet by mouth daily.         Discharge Instructions:  The patient is to refrain from driving, heavy lifting or strenuous activity.  May shower daily and clean incisions with soap  and water.  May resume regular diet.   Follow Up: Follow-up Information    Follow up with Melrose Nakayama, MD On 04/24/2014.   Specialty:  Cardiothoracic Surgery   Why:  Have a chest x-ray at [place] at 9:45, then see MD at 10:45    Contact information:  [address] [place] [address] [phone]       Follow up with [facility] On 04/13/2014.   Why:  For suture removal with Dr. Leonarda Salon nurse at 10:30   Contact information:   [place] [phone]       Follow up with [place].   Why:  3n1 arranged- to be delivered to room prior to discharge   Contact information:   [place] [address] [phone]         COLLINS,GINA H 04/09/2014, 10:44 AM

## 2014-04-05 NOTE — Care Management Note (Signed)
    Page 1 of 2   04/06/2014     3:48:08 PM CARE MANAGEMENT NOTE 04/06/2014  Patient:  Gwendolyn Miller, Gwendolyn Miller   Account Number:  [id number]  Date Initiated:  04/05/2014  Documentation initiated by:  Marvetta Gibbons  Subjective/Objective Assessment:   Pt admitted s/p VATS     Action/Plan:   PTA Pt lived at home with family   Anticipated DC Date:  04/08/2014   Anticipated DC Plan:  [place]  CM consult      Choice offered to / List presented to:  C-1 Patient   DME arranged  3-N-1      DME agency  [place] arranged  [place].   Status of service:  Completed, signed off Medicare Important Message given?  YES (If response is "NO", the following Medicare IM given date fields will be blank) Date Medicare IM given:  04/05/2014 Medicare IM given by:  Marvetta Gibbons Date Additional Medicare IM given:   Additional Medicare IM given by:    Discharge Disposition:  [place]  Per UR Regulation:  Reviewed for med. necessity/level of care/duration of stay  If discussed at [place] of Stay Meetings, dates discussed:    Comments:  04/06/14- 1445- Marvetta Gibbons RN, BSN [phone] Noted PT eval- with no recommendations for [facility] f/u- pt needs encouragement for mobilization and movement. have spoken with Jermaine with [facility] regarding 3n1- will deliver to room prior to discharge. Pt could have HH-PT and aide if needed- insurance will not cover for "assistance" in the home. NCM to continue to follow to see if pt starts to mobilize better. Plan to d/c home with or without HH? update- [phone]- received call from bedside RN- pt for d/c today-- order for HH-PT- up to speak with pt and offer choice for [facility] for [facility] agency- per pt choice she would like to use [facility] for services- referral called to Butch Penny with [facility] for HH-PT- call also made to [place] with [facility] -3n1 needed to be  delivered today   04/05/14- 1030- Janiyha Montufar RN, BSN [phone] Spoke with pt at bedside- per conversation pt states that she would like a 3n1 for discharge- will need DME order for 3n1- NCM to follow for any other potential d/c needs as pt progresses with mobility.

## 2014-04-05 NOTE — Progress Notes (Addendum)
[address]       [place] [address]             [phone]          3 Days Post-Op Procedure(s) (LRB): LEFT VIDEO ASSISTED THORACOSCOPY (VATS)/LEFT LOWER LOBE SEGMENTECTOMY (Left) LYMPH NODE DISSECTION (Left)  Subjective: Drowsy but arousable this am.  Had a lot of chest discomfort overnight but no pain presently.  Breathing stable.   Objective: Vital signs in last 24 hours: Patient Vitals for the past 24 hrs:  BP Temp Temp src Pulse Resp SpO2  04/05/14 0700 (!) 158/88 mmHg - - 79 18 100 %  04/05/14 0600 (!) 118/57 mmHg - - 83 (!) 21 100 %  04/05/14 0500 (!) 150/72 mmHg - - 85 (!) 21 100 %  04/05/14 0400 135/68 mmHg - - - (!) 23 100 %  04/05/14 0330 136/68 mmHg 97.8 F (36.6 C) Axillary 82 (!) 22 100 %  04/05/14 0300 136/67 mmHg - - - (!) 21 -  04/05/14 0230 130/65 mmHg - - - (!) 22 -  04/05/14 0215 125/62 mmHg - - - (!) 22 -  04/05/14 0200 129/62 mmHg - - - (!) 22 -  04/05/14 0145 126/65 mmHg - - - 20 -  04/05/14 0130 125/64 mmHg - - - (!) 21 -  04/05/14 0115 130/65 mmHg - - - (!) 23 -  04/05/14 0100 121/67 mmHg - - - (!) 25 -  04/05/14 0045 (!) 149/74 mmHg - - - (!) 25 -  04/05/14 0037 (!) 168/76 mmHg - - - 18 -  04/05/14 0036 - - - - - 100 %  04/05/14 0000 - - - - (!) 27 100 %  04/04/14 2349 (!) 155/77 mmHg 98.1 F (36.7 C) Oral 93 (!) 23 100 %  04/04/14 2023 - - - - - 96 %  04/04/14 1948 (!) 149/79 mmHg 99.2 F (37.3 C) Oral 99 (!) 30 100 %  04/04/14 1943 - - - - (!) 21 100 %  04/04/14 1700 - 99.5 F (37.5 C) Oral - - -  04/04/14 1635 (!) 151/74 mmHg - - - (!) 22 -  04/04/14 1528 - - - - (!) 26 100 %  04/04/14 1336 - 98.8 F (37.1 C) Oral - - 100 %  04/04/14 1120 - - - - 17 99 %  04/04/14 0800 - - - - (!) 23 100 %  04/04/14 0749 - 98.4 F (36.9 C) Oral 87 (!) 24 -   Current Weight  04/02/14 152 lb (68.947 kg)     Intake/Output from previous day: 02/24 0701 - 02/25 0700 In: 110 [I.V.:110] Out: 1500 [Urine:1225; Chest  Tube:275]  CBGs 165-106-100   PHYSICAL EXAM:  Heart: RRR Lungs: Clear, slightly diminished BS in bases Wound: Clean and dry Chest tube: No air leak    Lab Results: CBC: Recent Labs  04/03/14 0415 04/04/14 0544  WBC 7.5 7.6  HGB 11.5* 11.3*  HCT 36.5 36.1  PLT 178 172   BMET:  Recent Labs  04/04/14 0544 04/05/14 0414  NA 136 137  K 3.4* 3.7  CL 108 107  CO2 22 21  GLUCOSE 96 100*  BUN 8 10  CREATININE 0.70 0.73  CALCIUM 8.7 9.2    PT/INR: No results for input(s): LABPROT, INR in the last 72 hours.  Path: INVASIVE MODERATELY DIFFERENTIATED SQUAMOUS CELL CARCINOMA (T2a, N0)  CXR: stable left apical ptx  Assessment/Plan: S/P Procedure(s) (  LRB): LEFT VIDEO ASSISTED THORACOSCOPY (VATS)/LEFT LOWER LOBE SEGMENTECTOMY (Left) LYMPH NODE DISSECTION (Left) CXR with stable left ptx, CT with no air leak. Drainage decreasing,  195-80-30 over past 3 shifts. Hopefully can d/c remaining CT soon. Needs to ambulate in halls today. D/c On-Q. HTN- Home meds restarted, BPs still a bit elevated at times. Continue to monitor. DM- CBGs stable. Continue home meds.   LOS: 3 days    COLLINS,GINA H 04/05/2014  Patient seen and examined, agree with above No air leak and drainage down- dc CT Dc PCA and On-Q Possibly home tomorrow

## 2014-04-05 NOTE — Progress Notes (Signed)
Medicare Important Message given? YES  (If response is "NO", the following Medicare IM given date fields will be blank)  Date Medicare IM given: 04/05/14 Medicare IM given by:  Dahlia Client [employer]

## 2014-04-05 NOTE — Progress Notes (Signed)
Pt states she will walk in the morning. Will continue to monitor.

## 2014-04-06 ENCOUNTER — Inpatient Hospital Stay (HOSPITAL_COMMUNITY): Payer: Medicare Other

## 2014-04-06 LAB — GLUCOSE, CAPILLARY
GLUCOSE-CAPILLARY: 84 mg/dL (ref 70–99)
Glucose-Capillary: 104 mg/dL — ABNORMAL HIGH (ref 70–99)
Glucose-Capillary: 106 mg/dL — ABNORMAL HIGH (ref 70–99)

## 2014-04-06 MED ORDER — ONDANSETRON HCL 4 MG/2ML IJ SOLN
4.0000 mg | Freq: Four times a day (QID) | INTRAMUSCULAR | Status: DC | PRN
  Administered 2014-04-06 (×2): 4 mg via INTRAVENOUS
  Filled 2014-04-06 (×2): qty 2

## 2014-04-06 MED ORDER — KETOROLAC TROMETHAMINE 0.5 % OP SOLN
1.0000 [drp] | Freq: Three times a day (TID) | OPHTHALMIC | Status: DC
  Administered 2014-04-06: 1 [drp] via OPHTHALMIC
  Filled 2014-04-06: qty 5

## 2014-04-06 MED ORDER — TRAMADOL HCL 50 MG PO TABS: 50.0000 mg | ORAL_TABLET | Freq: Four times a day (QID) | ORAL | Status: AC | PRN

## 2014-04-06 MED ORDER — TRAMADOL HCL 50 MG PO TABS
50.0000 mg | ORAL_TABLET | Freq: Four times a day (QID) | ORAL | Status: DC | PRN
  Administered 2014-04-06: 100 mg via ORAL
  Filled 2014-04-06: qty 2

## 2014-04-06 NOTE — Progress Notes (Signed)
Pt to Xray.

## 2014-04-06 NOTE — Progress Notes (Signed)
Pt back from X-ray.  

## 2014-04-06 NOTE — Progress Notes (Signed)
Pt OOB to chair

## 2014-04-06 NOTE — Progress Notes (Signed)
Pt complaint of nausea, paged MD, orders received for Zofran 4mg  IV q6 PRN. Will administer and continue to monitor.

## 2014-04-06 NOTE — Progress Notes (Addendum)
       [address]       [place] [address]             [phone]          4 Days Post-Op Procedure(s) (LRB): LEFT VIDEO ASSISTED THORACOSCOPY (VATS)/LEFT LOWER LOBE SEGMENTECTOMY (Left) LYMPH NODE DISSECTION (Left)  Subjective: Nauseated earlier, but feeling better at present.  Bowels working.  Breathing stable.   Objective: Vital signs in last 24 hours: Patient Vitals for the past 24 hrs:  BP Temp Temp src Pulse Resp SpO2  04/06/14 0312 (!) 147/73 mmHg 98.5 F (36.9 C) Oral 92 20 100 %  04/05/14 2330 139/78 mmHg 97.7 F (36.5 C) Oral 88 20 100 %  04/05/14 2119 - - - - - 98 %  04/05/14 1947 (!) 106/56 mmHg 97.9 F (36.6 C) Oral 76 (!) 26 99 %  04/05/14 1525 (!) 151/78 mmHg - - 89 (!) 24 100 %  04/05/14 1120 (!) 156/84 mmHg 98 F (36.7 C) Oral 93 (!) 32 100 %  04/05/14 0800 137/69 mmHg - - 79 16 100 %   Current Weight  04/02/14 152 lb (68.947 kg)    CBGs 111-131-115  Intake/Output from previous day: 02/25 0701 - 02/26 0700 In: 250 [I.V.:50; IV Piggyback:200] Out: 305 [Urine:275; Chest Tube:30]    PHYSICAL EXAM:  Heart: RRR Lungs: Clear Wound: Clean and dry     Lab Results: CBC: Recent Labs  04/04/14 0544  WBC 7.6  HGB 11.3*  HCT 36.1  PLT 172   BMET:  Recent Labs  04/04/14 0544 04/05/14 0414  NA 136 137  K 3.4* 3.7  CL 108 107  CO2 22 21  GLUCOSE 96 100*  BUN 8 10  CREATININE 0.70 0.73  CALCIUM 8.7 9.2    PT/INR: No results for input(s): LABPROT, INR in the last 72 hours.  CXR: stable left apical pneumothorax   Assessment/Plan: S/P Procedure(s) (LRB): LEFT VIDEO ASSISTED THORACOSCOPY (VATS)/LEFT LOWER LOBE SEGMENTECTOMY (Left) LYMPH NODE DISSECTION (Left) Pt stable overall, just not moving much.  Refused to walk in hall yesterday. Will mobilize today. 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 Home once d/c arrangements finalized and pt is more ambulatory.   LOS: 4 days    COLLINS,GINA H 04/06/2014  She has walked around unit twice today. She says she is ready to go home Her husband and son are here and are ready to take her- dc home today

## 2014-04-06 NOTE — Evaluation (Signed)
Physical Therapy Evaluation Patient Details Name: Gwendolyn Miller MRN: [id number] DOB: 1931-03-12 Today's Date: 04/06/2014   History of Present Illness  Pt admitted for L VATS with left lower lobe segmentectomy  Clinical Impression  Pt with decreased motivation and desire for mobility who benefit from encouragement to maximize mobility and function. Pt reports feeling very weak and fatigued with bil LE 4-5/5 strength overall. Pt has been mobilizing very minimally acutely and repeatedly states she will be bedbound at home. Educated pt for importance of being OOB for all meals, walking 3x/day and continuing HEP. Pt will benefit from acute therapy to maximize mobility, strength and function to decrease burden of care.     Follow Up Recommendations No PT follow up    Equipment Recommendations  None recommended by PT    Recommendations for Other Services       Precautions / Restrictions Precautions Precautions: None      Mobility  Bed Mobility Overal bed mobility: Needs Assistance Bed Mobility: Supine to Sit     Supine to sit: Supervision     General bed mobility comments: cues for sequence and encouragement to complete  Transfers Overall transfer level: Modified independent                  Ambulation/Gait Ambulation/Gait assistance: Supervision Ambulation Distance (Feet): 300 Feet Assistive device: Rolling walker (2 wheeled) Gait Pattern/deviations: Step-through pattern;Decreased stride length   Gait velocity interpretation: Below normal speed for age/gender General Gait Details: cues for posture and position in RW  Stairs            Wheelchair Mobility    Modified Rankin (Stroke Patients Only)       Balance Overall balance assessment: Needs assistance   Sitting balance-Leahy Scale: Good       Standing balance-Leahy Scale: Fair                               Pertinent Vitals/Pain Pain Assessment: No/denies pain  100% on RA HR  97-100 throughout    Home Living Family/patient expects to be discharged to:: Private residence Living Arrangements: Spouse/significant other Available Help at Discharge: Family;Available 24 hours/day Type of Home: House Home Access: Stairs to enter Entrance Stairs-Rails: Psychiatric nurse of Steps: 3 Home Layout: One level Home Equipment: None      Prior Function Level of Independence: Independent         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     Journalist, newspaper        Extremity/Trunk Assessment   Upper Extremity Assessment: Overall WFL for tasks assessed           Lower Extremity Assessment: Overall WFL for tasks assessed RLE Deficits / Details: 4/5 hip flexion, knee flexion and extension LLE Deficits / Details: 4/5 hip flexion, knee flexion and extension  Cervical / Trunk Assessment: Normal  Communication   Communication: No difficulties  Cognition Arousal/Alertness: Awake/alert Behavior During Therapy: Flat affect Overall Cognitive Status: Within Functional Limits for tasks assessed                      General Comments      Exercises General Exercises - Lower Extremity Long Arc Quad: AROM;Seated;Both;15 reps Hip ABduction/ADduction: AROM;Seated;Both;15 reps Hip Flexion/Marching: AROM;Seated;Both;15 reps      Assessment/Plan    PT Assessment Patient needs continued PT services  PT Diagnosis Difficulty walking   PT Problem List Decreased activity tolerance;Decreased balance;Decreased mobility;Decreased  knowledge of use of DME  PT Treatment Interventions DME instruction;Gait training;Functional mobility training;Therapeutic activities;Patient/family education   PT Goals (Current goals can be found in the Care Plan section) Acute Rehab PT Goals Patient Stated Goal: go home PT Goal Formulation: With patient Time For Goal Achievement: 04/13/14 Potential to Achieve Goals: Good    Frequency Min 3X/week   Barriers to discharge         Co-evaluation               End of Session   Activity Tolerance: Patient tolerated treatment well Patient left: in chair;with call bell/phone within reach;with nursing/sitter in room Nurse Communication: Mobility status         Time: 9012-2241 PT Time Calculation (min) (ACUTE ONLY): 23 min   Charges:   PT Evaluation $Initial PT Evaluation Tier I: 1 Procedure PT Treatments $Gait Training: 8-22 mins   PT G CodesMelford Aase 04/06/2014, 1:31 PM Elwyn Reach, [place]

## 2014-04-06 NOTE — Progress Notes (Signed)
Utilization review completed.  

## 2014-04-06 NOTE — Progress Notes (Signed)
Pt currently refusing to ambulate to chair. States she will try at 8:00 AM. Will continue to monitor.

## 2014-04-10 DIAGNOSIS — Z483 Aftercare following surgery for neoplasm: Secondary | ICD-10-CM | POA: Diagnosis not present

## 2014-04-13 ENCOUNTER — Ambulatory Visit: Payer: Self-pay | Admitting: *Deleted

## 2014-04-13 DIAGNOSIS — Z9889 Other specified postprocedural states: Secondary | ICD-10-CM

## 2014-04-13 DIAGNOSIS — C3432 Malignant neoplasm of lower lobe, left bronchus or lung: Secondary | ICD-10-CM

## 2014-04-13 NOTE — Progress Notes (Signed)
Ms. Bremner has returned for suture removal x 2 from 2 previous chest tube sites s/p Left VATS, LLLobe Segmentectomy on 04/02/14.  The VATS incisions as well as the chest tube sites are all very well healed.  Her only complaint is low back pain since surgery. There are no urinary symptoms.  I suggested she use a warm heating pad .  She also has not had a bowel movement in a week and I stressed the importance of using a laxative today and remain on stool softeners until she is regular again.  She will return as scheduled with a chest xray.

## 2014-04-19 ENCOUNTER — Other Ambulatory Visit: Payer: Self-pay | Admitting: Thoracic Surgery (Cardiothoracic Vascular Surgery)

## 2014-04-19 DIAGNOSIS — Z902 Acquired absence of lung [part of]: Secondary | ICD-10-CM

## 2014-04-24 ENCOUNTER — Ambulatory Visit
Admission: RE | Admit: 2014-04-24 | Discharge: 2014-04-24 | Disposition: A | Discharge status: Home / Self Care | Payer: Medicare Other | Source: Ambulatory Visit | Attending: Thoracic Surgery (Cardiothoracic Vascular Surgery) | Admitting: Thoracic Surgery (Cardiothoracic Vascular Surgery)

## 2014-04-24 ENCOUNTER — Ambulatory Visit (INDEPENDENT_AMBULATORY_CARE_PROVIDER_SITE_OTHER): Payer: Self-pay | Admitting: Thoracic Surgery (Cardiothoracic Vascular Surgery)

## 2014-04-24 ENCOUNTER — Encounter: Payer: Self-pay | Admitting: Thoracic Surgery (Cardiothoracic Vascular Surgery)

## 2014-04-24 VITALS — BP 149/79 | HR 82 | Resp 16 | Ht 64.0 in | Wt 145.0 lb

## 2014-04-24 DIAGNOSIS — Z9889 Other specified postprocedural states: Secondary | ICD-10-CM

## 2014-04-24 DIAGNOSIS — C3432 Malignant neoplasm of lower lobe, left bronchus or lung: Secondary | ICD-10-CM

## 2014-04-24 DIAGNOSIS — C3492 Malignant neoplasm of unspecified part of left bronchus or lung: Secondary | ICD-10-CM | POA: Insufficient documentation

## 2014-04-24 DIAGNOSIS — Z902 Acquired absence of lung [part of]: Secondary | ICD-10-CM

## 2014-04-24 NOTE — Progress Notes (Signed)
[address]       [place] [address]             [phone]       HPI: Mrs. Qazi returns today for a scheduled postoperative follow-up visit.  She is an 81 year old former smoker who had a thoracoscopic left lower lobe superior segmentectomy on 04/02/2014. Pathology showed a T2a N0, stage Ib, non-small cell carcinoma. She did well postoperatively and was discharged home on postoperative day #4.  Since discharge she is continuing to do well. She's taking one oxycodone tablet at night before she has to bed and 1 when she wakes up in the morning. Her pain is well controlled with that. She's not had any problems with shortness of breath. Her appetite is fair.  Past Medical History  Diagnosis Date  . Hypertension   . Hyperlipidemia   . Anemia   . Vitamin D deficiency   . Osteoporosis   . Diabetes mellitus, type 2   . Arthritis       Current Outpatient Prescriptions  Medication Sig Dispense Refill  . alendronate (FOSAMAX) 70 MG tablet Take 70 mg by mouth once a week.   0  . amLODipine (NORVASC) 10 MG tablet Take 10 mg by mouth daily.     Marland Kitchen aspirin EC 81 MG tablet Take 81 mg by mouth daily.    Marland Kitchen atenolol (TENORMIN) 100 MG tablet Take 100 mg by mouth 2 (two) times daily.     . Cholecalciferol (VITAMIN D3) 2000 UNITS TABS Take 1 tablet by mouth daily.    . COSOPT PF 22.3-6.8 MG/ML SOLN Place 1 drop into the left eye 2 (two) times daily.   12  . gabapentin (NEURONTIN) 400 MG capsule Take 400-1,600 mg by mouth See admin instructions. Take 1 capsule every morning and 3-4 capsules by mouth every night at bedtime.  0  . ketorolac (ACULAR) 0.4 % SOLN Place 1 drop into the left eye 3 (three) times daily.     Marland Kitchen latanoprost (XALATAN) 0.005 % ophthalmic solution Place 1 drop into the left eye at bedtime.   12  . metFORMIN (GLUCOPHAGE) 500 MG tablet Take 500 mg by mouth 2 (two) times daily with a meal.    . omeprazole (PRILOSEC) 40 MG capsule Take 40 mg by mouth daily.     Marland Kitchen oxyCODONE-acetaminophen (PERCOCET/ROXICET) 5-325 MG per tablet Take 1 tablet by mouth 2 (two) times daily as needed for severe pain.   0  . simvastatin (ZOCOR) 40 MG tablet Take 40 mg by mouth at bedtime.    Marland Kitchen SPIRIVA HANDIHALER 18 MCG inhalation capsule Place 18 mcg into inhaler and inhale daily.   2  . SYMBICORT 160-4.5 MCG/ACT inhaler Inhale 2 puffs into the lungs 2 (two) times daily.   3  . topiramate (TOPAMAX) 100 MG tablet Take 100 mg by mouth at bedtime.     . traMADol (ULTRAM) 50 MG tablet Take 1-2 tablets (50-100 mg total) by mouth every 6 (six) hours as needed for moderate pain. 30 tablet 0  . guaiFENesin (MUCINEX) 600 MG 12 hr tablet Take 600 mg by mouth daily as needed for cough or to loosen phlegm.   1   No current facility-administered medications for this visit.    Physical Exam BP 149/79 mmHg  Pulse 82  Resp 16  Ht 5\' 4"  (1.626 m)  Wt 145 lb (65.772 kg)  BMI 24.88 kg/m2  SpO12 51% 81 year old woman in no acute  distress Alert and oriented 3 with no neurologic deficit Lungs clear with equal breath sounds bilaterally Cardiac regular rate and rhythm normal S1 and S2  Diagnostic Tests:  Pathology FINAL DIAGNOSIS Diagnosis 1. Lung, resection (segmental or lobe), left lower lobe - INVASIVE MODERATELY DIFFERENTIATED SQUAMOUS CELL CARCINOMA SPANNING 2.5 CM IN GREATEST DIMENSION. - VISCERAL PLEURAL INVASION IS PRESENT. - MARGINS ARE NEGATIVE. - SEE ONCOLOGY TEMPLATE. 2. Lymph node, biopsy, 11L - ONE BENIGN LYMPH NODE WITH NO TUMOR (0/1). 3. Lymph node, biopsy, 11L #2 - ONE BENIGN LYMPH NODE WITH NO TUMOR (0/1). 4. Lymph node, biopsy, 11L #3 - ONE BENIGN LYMPH NODE WITH NO TUMOR (0/1). 5. Lymph node, biopsy, 11L #4 - ONE BENIGN LYMPH NODE WITH NO TUMOR (0/1). 6. Lymph node, biopsy, 11L #5 - ONE BENIGN LYMPH NODE WITH NO TUMOR (0/1). 7. Lymph node, biopsy, 7 - ONE BENIGN LYMPH NODE WITH NO TUMOR (0/1). 8. Lymph node, biopsy, 11L #6 - ONE BENIGN LYMPH NODE WITH  NO TUMOR (0/1). 9. Lymph node, biopsy, 11L #7 - ONE BENIGN LYMPH NODE WITH NO TUMOR (0/1). 10. Lymph node, biopsy, 9L #1 - ONE BENIGN LYMPH NODE WITH NO TUMOR (0/1). 11. Lymph node, biopsy, 9L #2 - ONE BENIGN LYMPH NODE WITH NO TUMOR (0/1). 12. Lymph node, biopsy, 10L - ONE BENIGN LYMPH NODE WITH NO TUMOR (0/1). 13. Lymph node, biopsy, 5 1 of 4 FINAL for Verrill, [place] ([id number]) Diagnosis(continued) - ONE BENIGN LYMPH NODE WITH NO TUMOR (0/1). 14. Lymph node, biopsy, 5 #2 - ONE BENIGN LYMPH NODE WITH NO TUMOR (0/1). Microscopic Comment 1. LUNG Specimen, including laterality: Superior segment of left lower lobe with lymph node sampling. Procedure: Superior segmentectomy of left lower lobe with lymph node dissection. Specimen integrity (intact/disrupted): Intact. Tumor site: Superior segment of left lower lobe. Tumor focality: Unifocal. Maximum tumor size (cm): 2.5 cm. Histologic type: Squamous cell carcinoma. Grade: Moderately differentiated. Margins: Negative. Distance to closest margin (cm): The tumor is at least 0.7 cm (parenchymal resection margin). Visceral pleura invasion: Yes, identified. Tumor extension: Tumor involves lung parenchyma with visceral pleural extension. Treatment effect (if treated with neoadjuvant therapy): Not applicable. Lymph -Vascular invasion: Definitive lymph/vascular invasion is not identified. Lymph nodes: Number examined - 13 ; Number N1 nodes positive -0 ; Number N2 nodes positive - 0. TNM code: pT2a, pN0. Ancillary studies: Can be performed upon request. Non-neoplastic lung: Unremarkable. Comments: An immunohistochemical stain for cytokeratin 5/6 and TTF-1 is performed on a representative section of tumor. The cytokeratin 5/6 stain is positive while the TTF-1 stain is negative. The morphology coupled with the staining pattern is consistent with invasive squamous cell carcinoma. (RAH:gt, 04/03/14)  Chest x-ray today shows postoperative  changes on the left  Impression: 81 year old woman who underwent a thoracoscopic left lower lobe superior segmentectomy for a stage IB squamous cell carcinoma on February 22. She is recovering very well. She does have some incisional discomfort, but that is well controlled with oxycodone twice a day. That she continue to improve with time. I encouraged her to increase her physical activity. I advised her not to drive until she no longer is having to take the oxycodone during daytime hours.  She has a stage IB lesion. I do not think she will need adjuvant chemotherapy. But I do think it would be good idea for her to establish with an oncologist. She is from [place] and would rather go to [place] rather than come all the way to [place]. I am going to see if  we can schedule her for an appointment with Dr. Lavera Guise.  Plan: Arrange outpatient consultation with oncology.  I will see her back in 5 months with a CT of the chest for 6 month follow-up visit.  Melrose Nakayama, MD [facility] [phone]

## 2014-08-22 ENCOUNTER — Other Ambulatory Visit: Payer: Self-pay | Admitting: Thoracic Surgery (Cardiothoracic Vascular Surgery)

## 2014-08-22 DIAGNOSIS — C3492 Malignant neoplasm of unspecified part of left bronchus or lung: Secondary | ICD-10-CM

## 2014-09-18 ENCOUNTER — Ambulatory Visit (INDEPENDENT_AMBULATORY_CARE_PROVIDER_SITE_OTHER): Payer: Medicare Other | Admitting: Thoracic Surgery (Cardiothoracic Vascular Surgery)

## 2014-09-18 ENCOUNTER — Encounter: Payer: Self-pay | Admitting: Thoracic Surgery (Cardiothoracic Vascular Surgery)

## 2014-09-18 ENCOUNTER — Ambulatory Visit: Admission: RE | Admit: 2014-09-18 | Payer: Medicare Other | Source: Ambulatory Visit

## 2014-09-18 VITALS — BP 170/80 | HR 74 | Resp 20 | Ht 64.0 in | Wt 143.0 lb

## 2014-09-18 DIAGNOSIS — C3492 Malignant neoplasm of unspecified part of left bronchus or lung: Secondary | ICD-10-CM

## 2014-09-18 DIAGNOSIS — Z9889 Other specified postprocedural states: Secondary | ICD-10-CM | POA: Diagnosis not present

## 2014-09-18 NOTE — Progress Notes (Signed)
[address]       [place] [address]             [phone]       HPI: Mrs. Gwendolyn Miller turns for a scheduled 6 month postoperative follow-up visit.  She is an 81 year old former smoker who had a thoracoscopic left lower lobe superior segmentectomy on 04/02/2014. Pathology showed a T2a N0, stage IB, non-small cell carcinoma. She did well postoperatively and was discharged home on postoperative day #4.  She saw Dr. Lavera Guise who recommended observation. She recently saw him again on 09/10/2014. A CT of the chest showed a 7 x 9 mm opacity in the left lower lobe. The radiologist favored this being scarring rather than neoplasm. Dr. Bobby Rumpf has ordered a repeat CT in 4 months.  She says she's been feeling well. She does not have any incisional pain. She's not having any new problems with her breathing. Her weight is stable. Her appetite is good. She's not had any unusual headaches or visual changes.  Past Medical History  Diagnosis Date  . Hypertension   . Hyperlipidemia   . Anemia   . Vitamin D deficiency   . Osteoporosis   . Diabetes mellitus, type 2   . Arthritis   . Non-small cell carcinoma of left lung, stage 1     IB      Current Outpatient Prescriptions  Medication Sig Dispense Refill  . alendronate (FOSAMAX) 70 MG tablet Take 70 mg by mouth once a week.   0  . amLODipine (NORVASC) 10 MG tablet Take 10 mg by mouth daily.     Marland Kitchen aspirin EC 81 MG tablet Take 81 mg by mouth daily.    Marland Kitchen atenolol (TENORMIN) 100 MG tablet Take 100 mg by mouth 2 (two) times daily.     . Cholecalciferol (VITAMIN D3) 2000 UNITS TABS Take 1 tablet by mouth daily.    . COSOPT PF 22.3-6.8 MG/ML SOLN Place 1 drop into the left eye 2 (two) times daily.   12  . gabapentin (NEURONTIN) 400 MG capsule Take 400-1,600 mg by mouth See admin instructions. Take 1 capsule every morning and 3-4 capsules by mouth every night at bedtime.  0  . guaiFENesin (MUCINEX) 600 MG 12 hr tablet Take 600  mg by mouth daily as needed for cough or to loosen phlegm.   1  . ketorolac (ACULAR) 0.4 % SOLN Place 1 drop into the left eye 3 (three) times daily.     Marland Kitchen latanoprost (XALATAN) 0.005 % ophthalmic solution Place 1 drop into the left eye at bedtime.   12  . metFORMIN (GLUCOPHAGE) 500 MG tablet Take 500 mg by mouth 2 (two) times daily with a meal.    . omeprazole (PRILOSEC) 40 MG capsule Take 40 mg by mouth daily.    Marland Kitchen oxyCODONE-acetaminophen (PERCOCET/ROXICET) 5-325 MG per tablet Take 1 tablet by mouth 2 (two) times daily as needed for severe pain.   0  . simvastatin (ZOCOR) 40 MG tablet Take 40 mg by mouth at bedtime.    . topiramate (TOPAMAX) 100 MG tablet Take 100 mg by mouth at bedtime.     . traMADol (ULTRAM) 50 MG tablet Take 1-2 tablets (50-100 mg total) by mouth every 6 (six) hours as needed for moderate pain. 30 tablet 0   No current facility-administered medications for this visit.    Physical Exam BP 170/80 mmHg  Pulse 74  Resp 20  Ht '5\' 4"'$  (  1.626 m)  Wt 143 lb (64.864 kg)  BMI 24.53 kg/m2  SpO2 95% Elderly woman in no acute distress Alert and oriented 3 no focal deficits No cervical or supraclavicular adenopathy Lungs clear bilaterally Cardiac regular rate and rhythm normal S1 and S2  Diagnostic Tests: CT chest [facility] 09/05/2014  Impression 1. Status post left lower lobe wedge resection. Nonspecific irregular 0.9 cm nodular focus in the peripheral left lower lobe, favor nodular scarring. Recommend short-term follow-up chest CT in 3 months. 2. Indeterminate 1.5 cm left adrenal nodule. If no prior outside imaging is available to characterize this left adrenal nodule is benign, consider further evaluation with dedicated adrenal protocol CT or MRI of the abdomen with and without intravenous contrast. 3. Atherosclerosis, including two-vessel coronary disease. Please note that although the presence of coronary artery calcium documents the presence of coronary artery  disease, the severity of this disease and any potential stenosis cannot be assessed on this non-gated CT examination.  I personally reviewed the CT scan and concur with the findings as noted.   Impression: 81 year old woman who is now 6 months out from a left lower lobe superior segmentectomy for a T2, N0, stage IB non-small cell carcinoma. Her CT from last week shows some nodularity in the left lower lobe. This likely is scarring, but the possibility of recurrence cannot be ruled out. I agree with Dr. Jaclyn Shaggy plan to repeat a CT to reassess that in a couple of months. If it increases in size biopsy would be warranted. She would probably be a candidate for radiation therapy, but would not be a candidate for further surgery.  Her blood pressure was elevated at 170/80. She has a blood pressure cuff at home. I recommended that she check on a regular basis. She should follow-up with Dr. Judi Cong regarding her hypertension.  Plan: I will see her back in about 5 months for her one-year follow-up visit. We will review her CT at that time. Of course I will always be happy to see her back in the interim if the need should arise.  Melrose Nakayama, MD [facility] [phone]

## 2015-01-10 DIAGNOSIS — C3431 Malignant neoplasm of lower lobe, right bronchus or lung: Secondary | ICD-10-CM

## 2015-02-06 ENCOUNTER — Other Ambulatory Visit: Payer: Self-pay | Admitting: *Deleted

## 2015-02-06 DIAGNOSIS — R918 Other nonspecific abnormal finding of lung field: Secondary | ICD-10-CM

## 2015-02-26 ENCOUNTER — Encounter: Payer: Self-pay | Admitting: Thoracic Surgery (Cardiothoracic Vascular Surgery)

## 2015-02-26 ENCOUNTER — Ambulatory Visit: Admission: RE | Admit: 2015-02-26 | Payer: Medicare Other | Source: Ambulatory Visit

## 2015-02-26 ENCOUNTER — Ambulatory Visit (INDEPENDENT_AMBULATORY_CARE_PROVIDER_SITE_OTHER): Payer: Medicare Other | Admitting: Thoracic Surgery (Cardiothoracic Vascular Surgery)

## 2015-02-26 VITALS — BP 188/86 | HR 76 | Resp 16 | Ht 64.0 in | Wt 151.5 lb

## 2015-02-26 DIAGNOSIS — C3492 Malignant neoplasm of unspecified part of left bronchus or lung: Secondary | ICD-10-CM

## 2015-02-26 DIAGNOSIS — Z09 Encounter for follow-up examination after completed treatment for conditions other than malignant neoplasm: Secondary | ICD-10-CM

## 2015-02-26 NOTE — Progress Notes (Signed)
[address]       [place] [address]             [phone]       HPI: Gwendolyn Miller returns today for a scheduled 1 year follow-up visit.  She is an 82 year old woman who had a thoracoscopic left lower lobe superior segmentectomy in February 2016. His turned out to be a T2a, N0, stage IB non-small cell carcinoma. She was seen by Dr. Lavera Guise who recommended observation. On a CT in August there was a 6 x 9 mm area in the left lower lobe. A repeat CT was done at the end of November. She saw Dr. Bobby Rumpf at that time.  Today she is concerned about her blood pressure being so high. She says that she has been taking her medications on a regular basis. She's not aware of any high blood pressure. She says she had a bad headache about 2 days ago but is not having 1 currently. Her appetite is good, her weight is stable.   Past Medical History  Diagnosis Date  . Hypertension   . Hyperlipidemia   . Anemia   . Vitamin D deficiency   . Osteoporosis   . Diabetes mellitus, type 2 ([place])   . Arthritis   . Non-small cell carcinoma of left lung, stage 1 ([place])     IB     Current Outpatient Prescriptions  Medication Sig Dispense Refill  . alendronate (FOSAMAX) 70 MG tablet Take 70 mg by mouth once a week.   0  . amLODipine (NORVASC) 10 MG tablet Take 10 mg by mouth daily.     Marland Kitchen aspirin EC 81 MG tablet Take 81 mg by mouth daily.    Marland Kitchen atenolol (TENORMIN) 100 MG tablet Take 100 mg by mouth 2 (two) times daily.     . Cholecalciferol (VITAMIN D3) 2000 UNITS TABS Take 1 tablet by mouth daily.    . COSOPT PF 22.3-6.8 MG/ML SOLN Place 1 drop into the left eye 2 (two) times daily.   12  . gabapentin (NEURONTIN) 400 MG capsule Take 400-1,600 mg by mouth See admin instructions. Take 1 capsule every morning and 3-4 capsules by mouth every night at bedtime.  0  . guaiFENesin (MUCINEX) 600 MG 12 hr tablet Take 600 mg by mouth daily as needed for cough or to loosen phlegm.   1  .  ketorolac (ACULAR) 0.4 % SOLN Place 1 drop into the left eye 3 (three) times daily.     Marland Kitchen latanoprost (XALATAN) 0.005 % ophthalmic solution Place 1 drop into the left eye at bedtime.   12  . metFORMIN (GLUCOPHAGE) 500 MG tablet Take 500 mg by mouth 2 (two) times daily with a meal.    . omeprazole (PRILOSEC) 40 MG capsule Take 40 mg by mouth daily.    Marland Kitchen oxyCODONE-acetaminophen (PERCOCET/ROXICET) 5-325 MG per tablet Take 1 tablet by mouth 2 (two) times daily as needed for severe pain.   0  . simvastatin (ZOCOR) 40 MG tablet Take 40 mg by mouth at bedtime.    . topiramate (TOPAMAX) 100 MG tablet Take 100 mg by mouth at bedtime.     . traMADol (ULTRAM) 50 MG tablet Take 1-2 tablets (50-100 mg total) by mouth every 6 (six) hours as needed for moderate pain. 30 tablet 0   No current facility-administered medications for this visit.    Physical Exam BP 188/86 mmHg  Pulse 76  Resp 16  Ht '5\' 4"'$  (1.626 m)  Wt 151 lb 8 oz (68.72 kg)  BMI 25.99 kg/m2  SpO2 99% Elderly woman in no acute distress Hard of hearing Alert and oriented 3 with no focal deficit Cardiac regular rate and rhythm normal S1 and S2 Lungs clear with equal breath sounds bilaterally No cervical or supraclavicular adenopathy  Diagnostic Tests: I personally reviewed her CT from November 30. It shows no change in the 6 x 9 mm opacity in the left lower lobe. This is most likely scarring. There was no evidence of recurrent disease.  Impression: Gwendolyn Miller is an 82 year old woman who is now almost a year out from a thoracoscopic left lower lobe superior segmentectomy for a stage IB non-small cell carcinoma.  She did not have any imaging done today. She had a CT in November to follow-up a questionable abnormality from August. PET/CT showed no evidence of recurrent disease. The area in question is likely scar. Dr. Bobby Rumpf is following that.  Hypertension- her blood pressure was extremely elevated at 188/86 in the office today. I  recommended that she follow-up and get her blood pressure checked again tomorrow. Unfortunately she lives in [place] so it is not convenient for her to come by our office to get that checked. She will check with her primary.  To help cut down on travel time and expense I will defer to Dr. Bobby Rumpf for follow-up regarding her lung cancer. I will be happy to see her back any time if I can be of any further assistance with her care.  Plan: Follow-up Dr. Jenny Reichmann regarding blood pressure  Follow-up with Dr. Bobby Rumpf regarding lung cancer  Melrose Nakayama, MD [facility] [phone]

## 2015-05-14 DIAGNOSIS — Z85118 Personal history of other malignant neoplasm of bronchus and lung: Secondary | ICD-10-CM | POA: Diagnosis not present

## 2015-09-18 DIAGNOSIS — C3432 Malignant neoplasm of lower lobe, left bronchus or lung: Secondary | ICD-10-CM | POA: Diagnosis not present

## 2015-12-19 IMAGING — CR DG CHEST 1V PORT
1 series · 1 of 1 positions shown · non-contrast
Comparison: Portable chest x-ray of 04/03/2014

CLINICAL DATA: History of lung carcinoma, followup

EXAM:
PORTABLE CHEST - 1 VIEW

[AP]
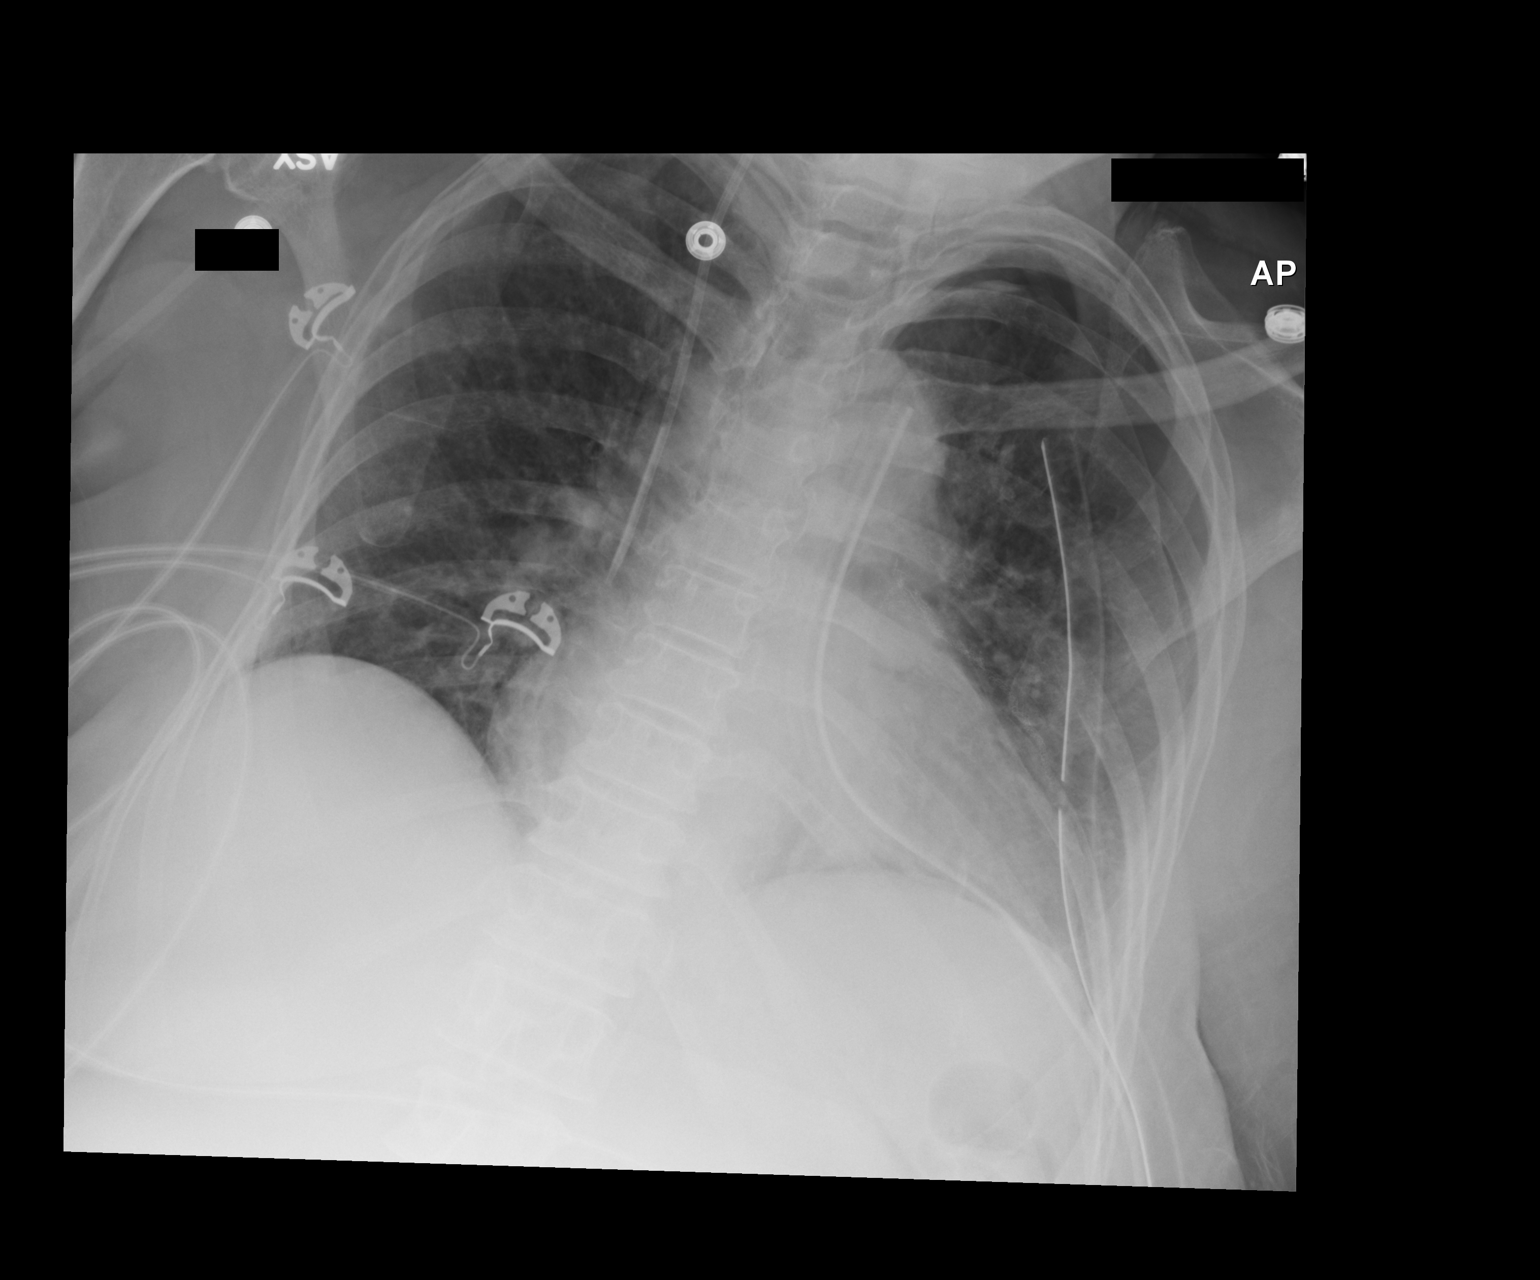

[1 of 1 positions shown; findings below may reference images not displayed]

FINDINGS: There is no significant change in the volume of the left apical
pneumothorax with 2 left chest tubes remaining. Left mild left
basilar atelectasis is present. The right lung is clear. The right
IJ central venous line tip overlies the expected SVC -RA junction.
Heart size is unchanged.
IMPRESSION: No change in volume of left pneumothorax with 2 left chest tubes
remaining.

## 2015-12-20 IMAGING — CR DG CHEST 1V PORT
1 series · 1 of 1 positions shown · non-contrast
Comparison: April 04, 2014

CLINICAL DATA: Lung carcinoma, status post thoracotomy

EXAM:
PORTABLE CHEST - 1 VIEW

[AP]
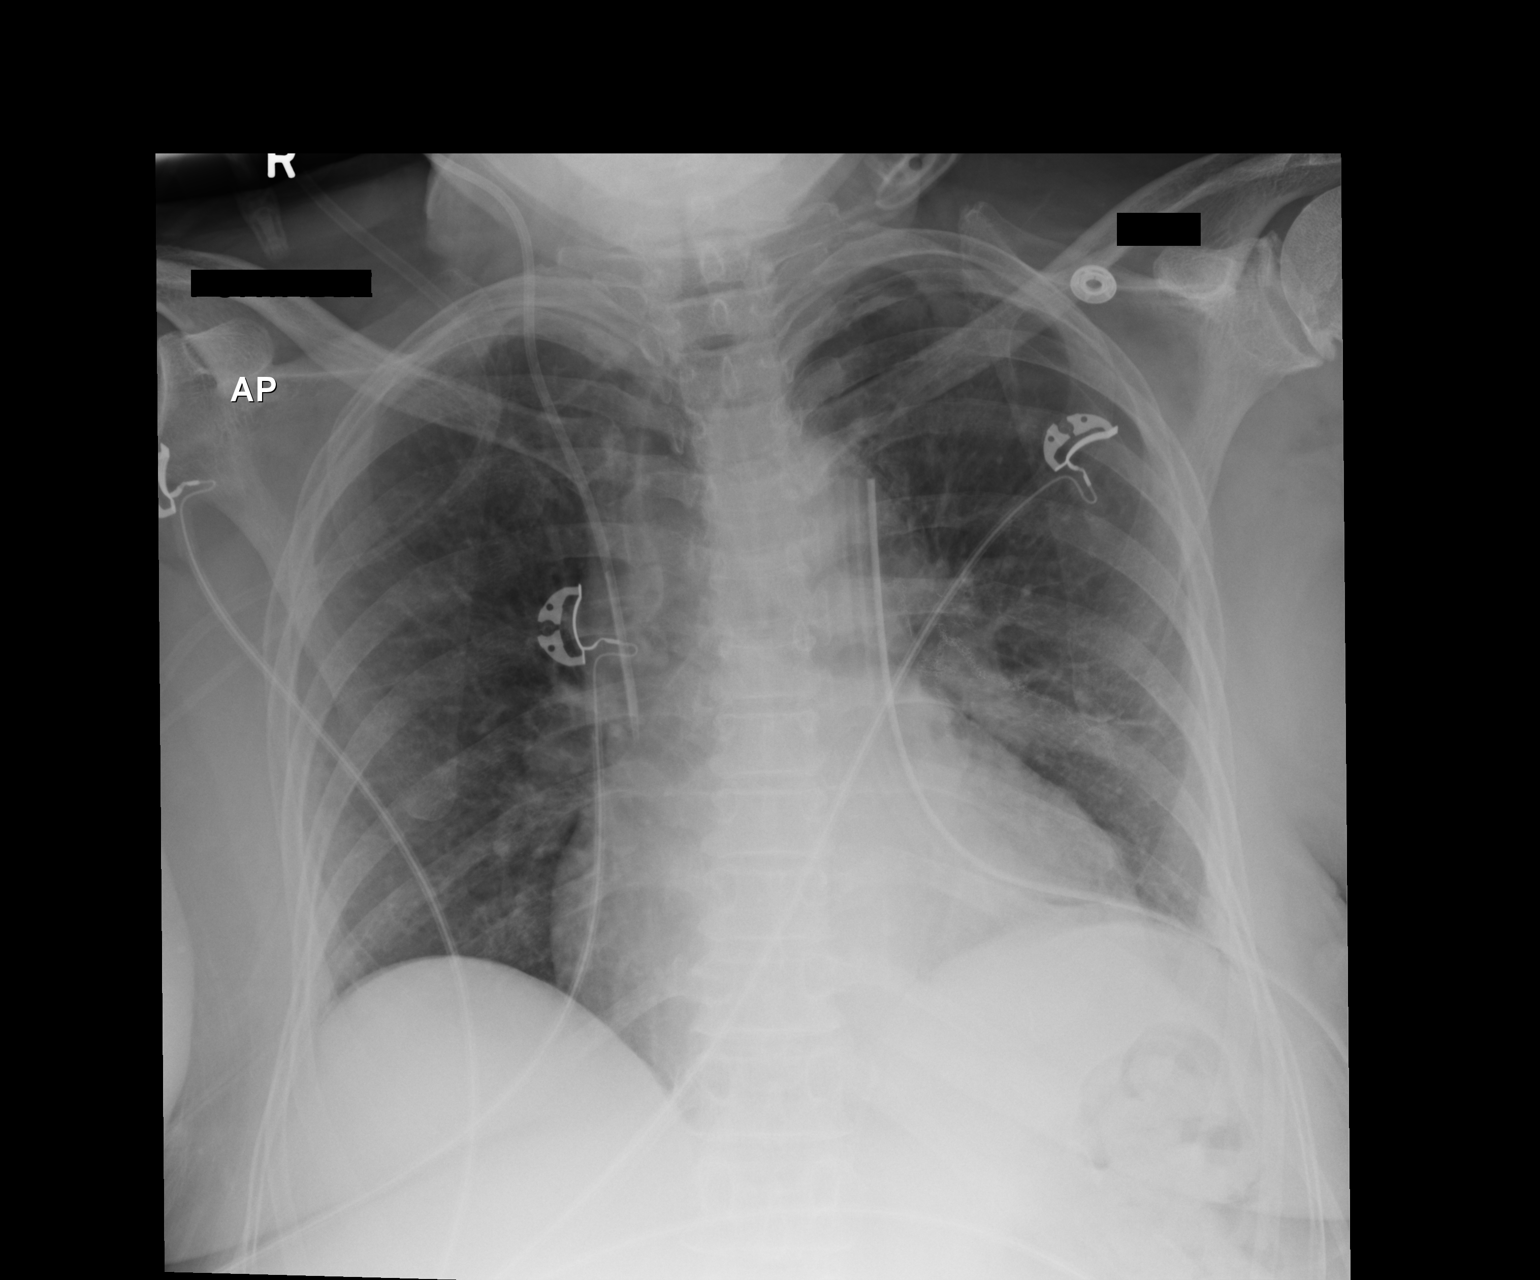

[1 of 1 positions shown; findings below may reference images not displayed]

FINDINGS: There is postoperative change on the left with atelectasis in the
left lower lobe. Chest tube remains on the left. Fairly small left
apical pneumothorax is stable without tension component. Lungs
elsewhere clear. Heart is mildly enlarged with pulmonary vascularity
within normal limits. Central catheter tip is in the superior vena
cava. Conclusion
IMPRESSION: Stable small pneumothorax on the left without tension component.
Chest tube remains in place on the left. Atelectasis left lower
lobe, stable. No new opacity. No change in cardiac silhouette.

## 2015-12-20 IMAGING — CR DG CHEST 1V PORT
1 series · 1 of 1 positions shown · non-contrast
Comparison: Chest x-ray 04/05/2014.

CLINICAL DATA: 82-year-old female status post left chest tube
removal.

EXAM:
PORTABLE CHEST - 1 VIEW

[AP]
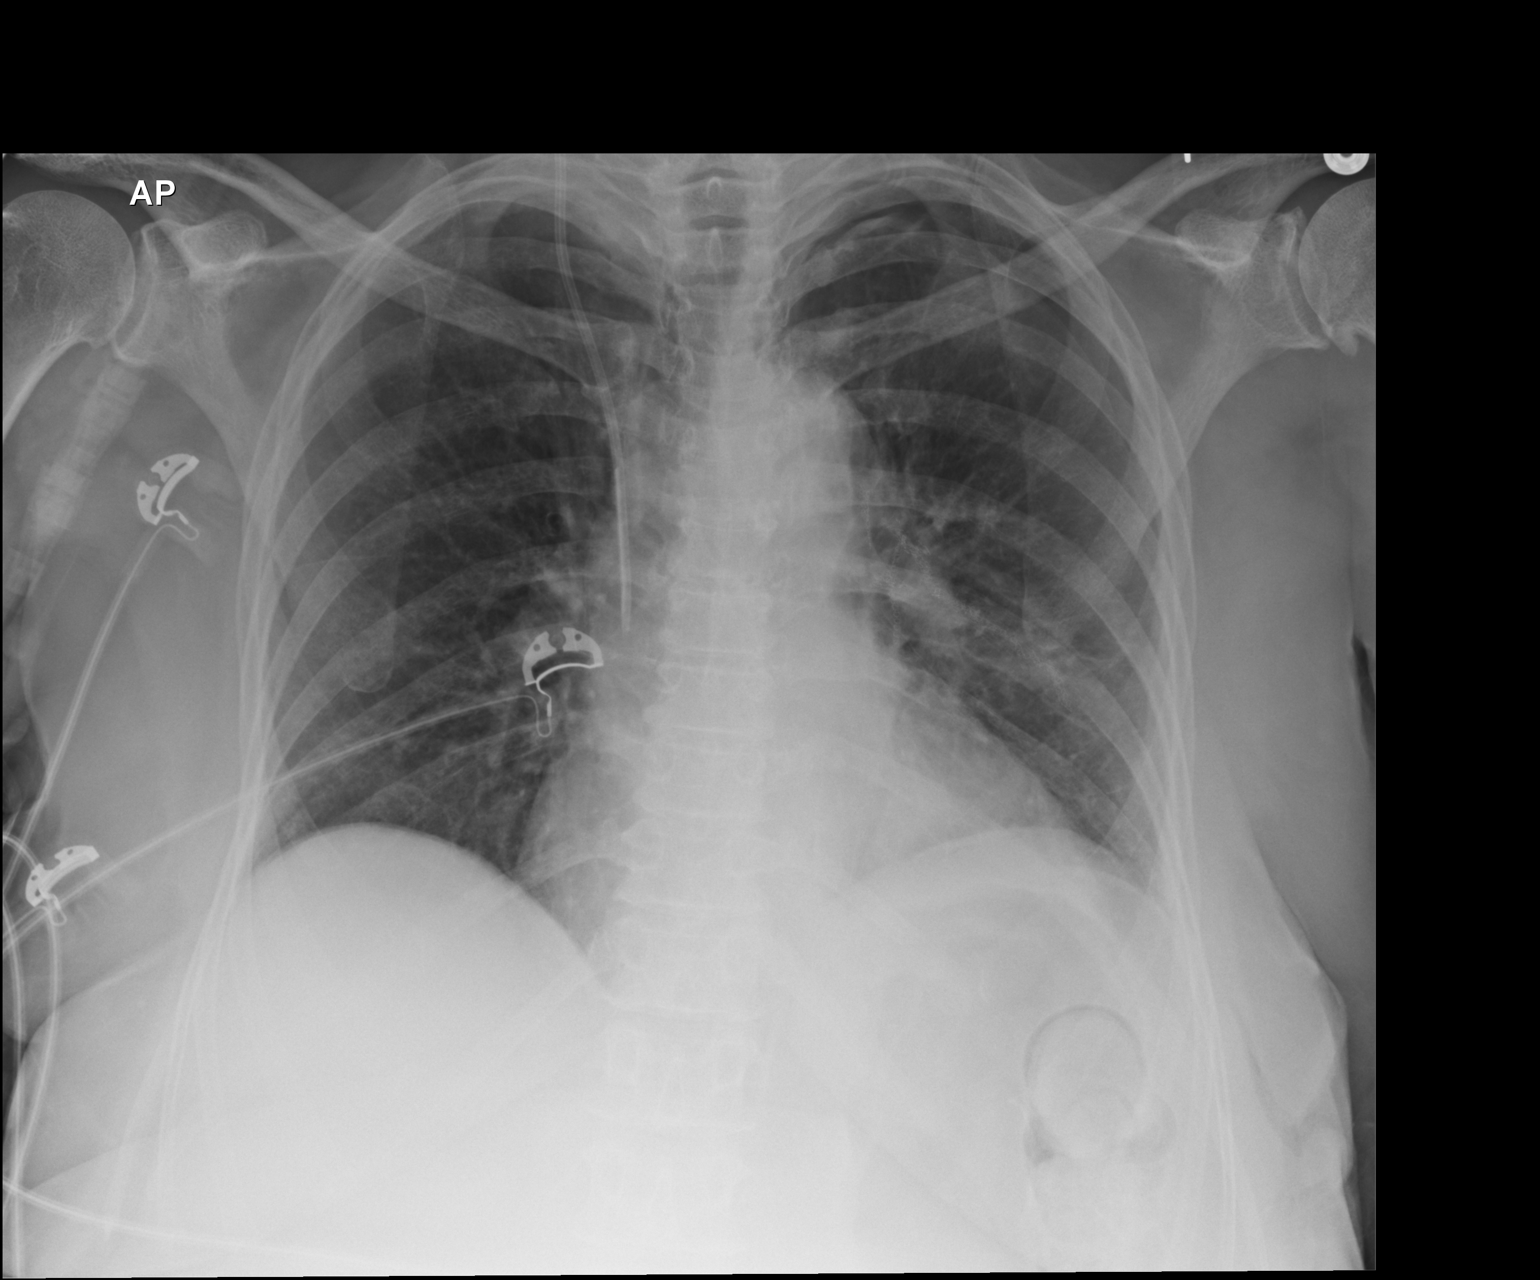

[1 of 1 positions shown; findings below may reference images not displayed]

FINDINGS: Previously noted left chest tube has been removed. Slight interval
enlargement of a small left apical pneumothorax (less than 10% of
the volume of the left hemithorax). Low lung volumes with bibasilar
(left greater than right) opacities, favored to reflect subsegmental
atelectasis. Postoperative changes of left lower lobe superior
segmentectomy are again noted. Ill-defined opacity in the left mid
lung, likely reflects some resolving postoperative change. Right
lung is clear. No pleural effusions. No evidence of pulmonary edema.
Heart size is normal. Upper mediastinal contours are within normal
limits. Right IJ central venous catheter with tip terminating in the
distal superior vena cava.
IMPRESSION: 1. Postoperative changes and support apparatus, as above.
2. Following removal of chest tube, small left-sided pneumothorax
has slightly increased, currently occupying less than 10% of the
volume of the left hemithorax. Close attention on short-term
follow-up is recommended to ensure stability.
3. Low lung volumes with probable bibasilar subsegmental atelectasis
(left greater than right).

## 2015-12-21 IMAGING — DX DG CHEST 2V
2 series · 2 of 2 positions shown · non-contrast
Comparison: 04/05/2014

CLINICAL DATA: History of lung cancer. Evaluate pneumothorax. Post
superior segmentectomy of the left lower lobe.

EXAM:
CHEST  2 VIEW

[chest pa]
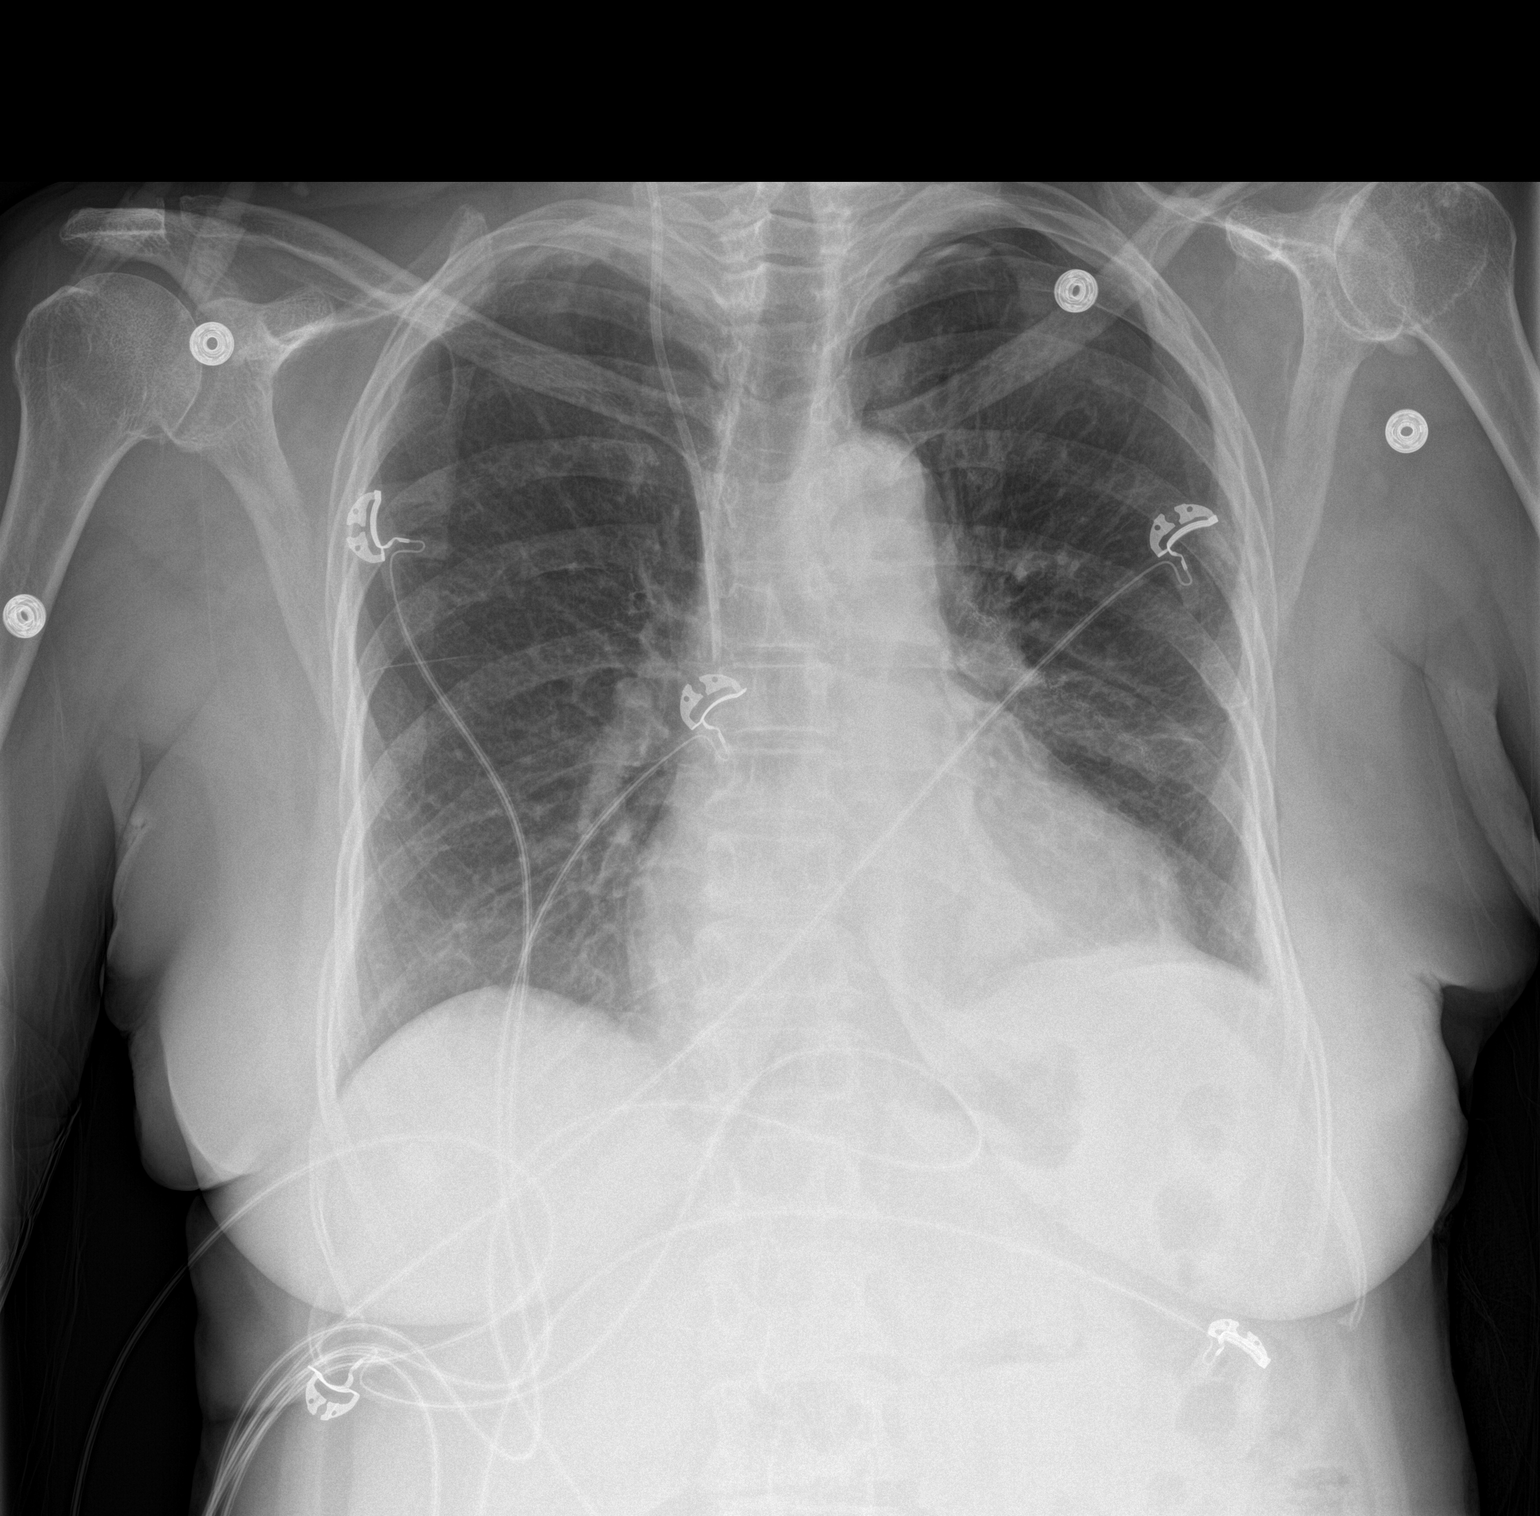

[chest lat]
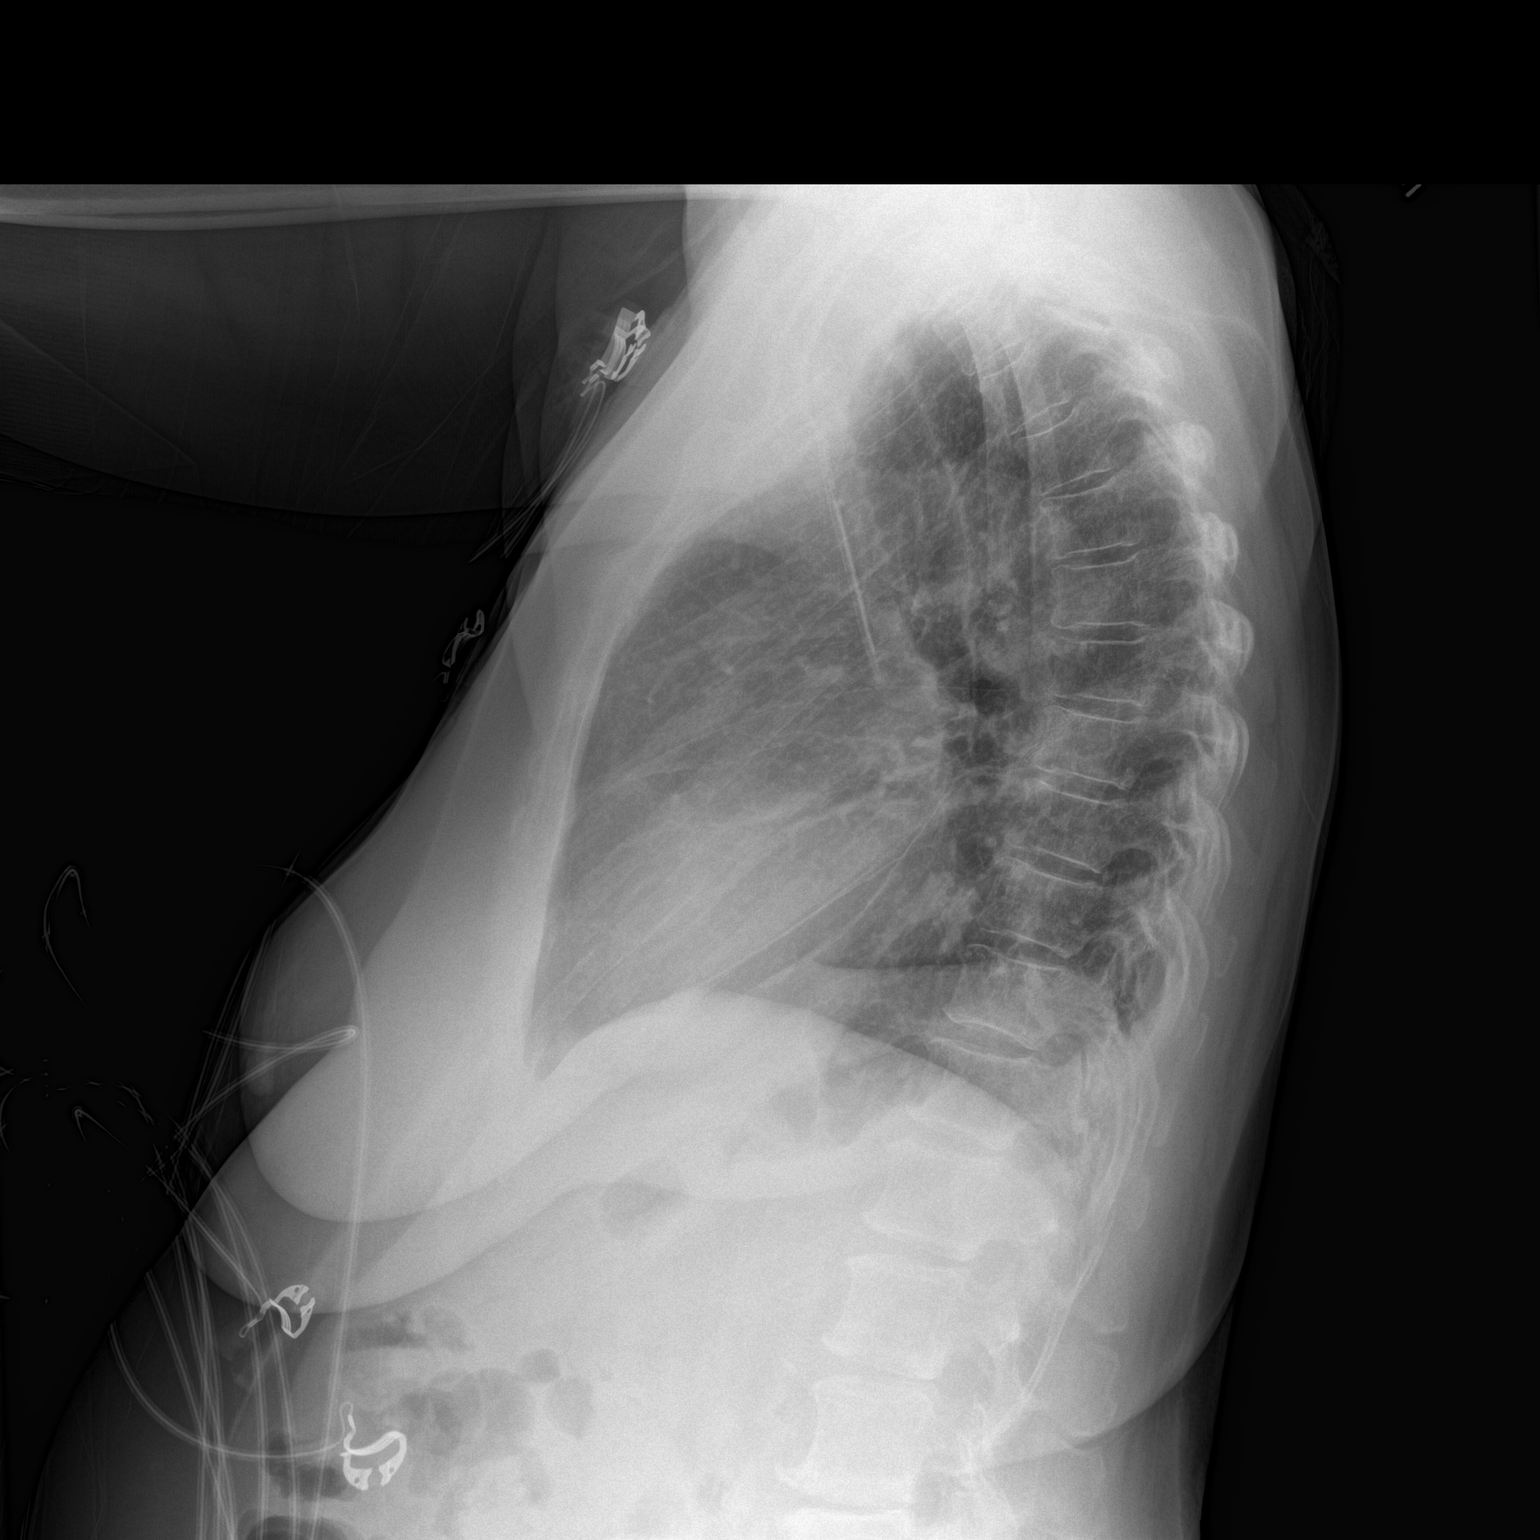

[2 of 2 positions shown; findings below may reference images not displayed]

FINDINGS: There is a stable small left pneumothorax. Trachea is midline.
Jugular central line in the the SVC region. Heart size is stable.
Focal nodular density at the left lung base is most likely related
to atelectasis. No significant pleural fluid.
IMPRESSION: Stable small left pneumothorax.

Left basilar density is likely related to focal atelectasis.

## 2016-01-28 DIAGNOSIS — Z85118 Personal history of other malignant neoplasm of bronchus and lung: Secondary | ICD-10-CM

## 2016-08-07 DIAGNOSIS — Z85118 Personal history of other malignant neoplasm of bronchus and lung: Secondary | ICD-10-CM

## 2017-02-05 DIAGNOSIS — Z85118 Personal history of other malignant neoplasm of bronchus and lung: Secondary | ICD-10-CM

## 2017-02-05 DIAGNOSIS — Z902 Acquired absence of lung [part of]: Secondary | ICD-10-CM | POA: Diagnosis not present

## 2017-08-06 DIAGNOSIS — Z902 Acquired absence of lung [part of]: Secondary | ICD-10-CM

## 2017-08-06 DIAGNOSIS — Z85118 Personal history of other malignant neoplasm of bronchus and lung: Secondary | ICD-10-CM

## 2018-02-22 DIAGNOSIS — Z85118 Personal history of other malignant neoplasm of bronchus and lung: Secondary | ICD-10-CM

## 2018-02-22 DIAGNOSIS — Z902 Acquired absence of lung [part of]: Secondary | ICD-10-CM | POA: Diagnosis not present

## 2018-12-29 DIAGNOSIS — Z85118 Personal history of other malignant neoplasm of bronchus and lung: Secondary | ICD-10-CM

## 2019-09-13 DIAGNOSIS — C3431 Malignant neoplasm of lower lobe, right bronchus or lung: Secondary | ICD-10-CM | POA: Diagnosis not present
